# Patient Record
Sex: Male | Born: 1935
Health system: Southern US, Community
[De-identification: ages and names within clinical notes are randomized; demographics above are authoritative.]

## PROBLEM LIST (undated history)

## (undated) DIAGNOSIS — Z974 Presence of external hearing-aid: Secondary | ICD-10-CM

## (undated) DIAGNOSIS — Z789 Other specified health status: Secondary | ICD-10-CM

## (undated) DIAGNOSIS — Z8679 Personal history of other diseases of the circulatory system: Secondary | ICD-10-CM

## (undated) DIAGNOSIS — D4620 Refractory anemia with excess of blasts, unspecified: Secondary | ICD-10-CM

## (undated) DIAGNOSIS — Z9889 Other specified postprocedural states: Secondary | ICD-10-CM

## (undated) DIAGNOSIS — Z973 Presence of spectacles and contact lenses: Secondary | ICD-10-CM

## (undated) DIAGNOSIS — N35919 Unspecified urethral stricture, male, unspecified site: Secondary | ICD-10-CM

## (undated) DIAGNOSIS — I723 Aneurysm of iliac artery: Secondary | ICD-10-CM

## (undated) DIAGNOSIS — B029 Zoster without complications: Secondary | ICD-10-CM

## (undated) DIAGNOSIS — Z7189 Other specified counseling: Secondary | ICD-10-CM

## (undated) DIAGNOSIS — E039 Hypothyroidism, unspecified: Secondary | ICD-10-CM

## (undated) DIAGNOSIS — D46A Refractory cytopenia with multilineage dysplasia: Secondary | ICD-10-CM

## (undated) DIAGNOSIS — F32A Depression, unspecified: Secondary | ICD-10-CM

## (undated) DIAGNOSIS — F329 Major depressive disorder, single episode, unspecified: Secondary | ICD-10-CM

## (undated) DIAGNOSIS — E785 Hyperlipidemia, unspecified: Secondary | ICD-10-CM

## (undated) DIAGNOSIS — N319 Neuromuscular dysfunction of bladder, unspecified: Secondary | ICD-10-CM

## (undated) DIAGNOSIS — M79673 Pain in unspecified foot: Secondary | ICD-10-CM

## (undated) DIAGNOSIS — I739 Peripheral vascular disease, unspecified: Secondary | ICD-10-CM

## (undated) DIAGNOSIS — Z972 Presence of dental prosthetic device (complete) (partial): Secondary | ICD-10-CM

## (undated) HISTORY — PX: INGUINAL HERNIA REPAIR: SUR1180

## (undated) HISTORY — DX: Hereditary hemochromatosis: E83.110

## (undated) HISTORY — DX: Zoster without complications: B02.9

## (undated) HISTORY — DX: Other specified counseling: Z71.89

## (undated) HISTORY — DX: Pain in unspecified foot: M79.673

## (undated) HISTORY — DX: Neuromuscular dysfunction of bladder, unspecified: N31.9

## (undated) HISTORY — DX: Refractory anemia with excess of blasts, unspecified: D46.20

---

## 2006-04-16 ENCOUNTER — Inpatient Hospital Stay (HOSPITAL_COMMUNITY): Admission: EM | Admit: 2006-04-16 | Discharge: 2006-04-23 | Payer: Self-pay | Admitting: Emergency Medicine

## 2006-04-16 HISTORY — PX: LAMINECTOMY THORACIC FUSION POSTERIOR: SHX5887

## 2006-04-21 ENCOUNTER — Ambulatory Visit: Payer: Self-pay | Admitting: Physical Medicine & Rehabilitation

## 2006-04-23 ENCOUNTER — Inpatient Hospital Stay (HOSPITAL_COMMUNITY)
Admission: RE | Admit: 2006-04-23 | Discharge: 2006-05-12 | Payer: Self-pay | Admitting: Physical Medicine & Rehabilitation

## 2006-06-18 ENCOUNTER — Encounter
Admission: RE | Admit: 2006-06-18 | Discharge: 2006-09-16 | Payer: Self-pay | Admitting: Physical Medicine & Rehabilitation

## 2006-06-18 ENCOUNTER — Ambulatory Visit: Payer: Self-pay | Admitting: Physical Medicine & Rehabilitation

## 2006-08-14 ENCOUNTER — Ambulatory Visit: Payer: Self-pay | Admitting: Physical Medicine & Rehabilitation

## 2006-08-27 ENCOUNTER — Encounter
Admission: RE | Admit: 2006-08-27 | Discharge: 2006-11-19 | Payer: Self-pay | Admitting: Physical Medicine & Rehabilitation

## 2006-11-06 ENCOUNTER — Encounter
Admission: RE | Admit: 2006-11-06 | Discharge: 2007-02-04 | Payer: Self-pay | Admitting: Physical Medicine & Rehabilitation

## 2006-11-06 ENCOUNTER — Ambulatory Visit: Payer: Self-pay | Admitting: Physical Medicine & Rehabilitation

## 2008-04-13 ENCOUNTER — Encounter: Admission: RE | Admit: 2008-04-13 | Discharge: 2008-04-13 | Payer: Self-pay | Admitting: Neurosurgery

## 2008-05-13 ENCOUNTER — Ambulatory Visit (HOSPITAL_COMMUNITY): Admission: RE | Admit: 2008-05-13 | Discharge: 2008-05-13 | Payer: Self-pay | Admitting: Neurosurgery

## 2009-03-22 ENCOUNTER — Encounter (INDEPENDENT_AMBULATORY_CARE_PROVIDER_SITE_OTHER): Payer: Self-pay | Admitting: Neurosurgery

## 2009-03-22 ENCOUNTER — Ambulatory Visit (HOSPITAL_COMMUNITY): Admission: RE | Admit: 2009-03-22 | Discharge: 2009-03-22 | Payer: Self-pay | Admitting: Neurosurgery

## 2009-03-22 ENCOUNTER — Ambulatory Visit: Payer: Self-pay | Admitting: Surgery

## 2010-08-08 ENCOUNTER — Other Ambulatory Visit: Payer: Self-pay | Admitting: Family Medicine

## 2010-08-08 DIAGNOSIS — I714 Abdominal aortic aneurysm, without rupture: Secondary | ICD-10-CM

## 2010-08-09 ENCOUNTER — Ambulatory Visit
Admission: RE | Admit: 2010-08-09 | Discharge: 2010-08-09 | Disposition: A | Discharge status: Home / Self Care | Payer: 59 | Source: Ambulatory Visit | Attending: Family Medicine | Admitting: Family Medicine

## 2010-08-09 DIAGNOSIS — I714 Abdominal aortic aneurysm, without rupture: Secondary | ICD-10-CM

## 2010-09-25 ENCOUNTER — Encounter (INDEPENDENT_AMBULATORY_CARE_PROVIDER_SITE_OTHER): Payer: 59 | Admitting: Vascular Surgery

## 2010-09-25 DIAGNOSIS — I714 Abdominal aortic aneurysm, without rupture: Secondary | ICD-10-CM

## 2010-09-26 NOTE — Consult Note (Signed)
NEW PATIENT CONSULTATION  Andrew [name], Andrew [name] DOB:  08/16/1932                                       09/25/2010 AOZHY#:[id number]  Patient presents today for evaluation of infrarenal abdominal aortic aneurysm.  He is a very pleasant gentleman who initially had discovery of this aneurysm at the time of a fractured spine in 2007.  This has been followed with ultrasound since that time and now has progressed to a level of 4.8 cm by ultrasound.  He has no symptoms referable to his aneurysm.  He does have chronic back pain.  PAST MEDICAL HISTORY:  Remarkable for overall good health.  He is hypothyroid.  Does not have any history of cardiac disease.  He is not diabetic.  SOCIAL HISTORY:  Is married, 2 children.  He is retired.  He quit smoking in 1987 and does not drink alcohol.  FAMILY HISTORY:  Negative for premature obstructive disease.  REVIEW OF SYSTEMS:  No weight loss or gain.  He weighs 211 pounds.  He is 5 feet 8 inches tall. VASCULAR:  Positive for pain in his legs with walking and lying flat. CARDIAC:  Negative. GI:  Constipation. URINARY:  Difficult urination. ENT:  Change in eyesight, otherwise review of systems is negative.  PHYSICAL EXAMINATION:  A well-developed and well-nourished __________ male appearing stated age in no acute distress.  Blood pressure is 134/82, pulse 77, respirations 18.  HEENT is normal.  His chest is clear bilaterally without rales, rhonchi or wheezes.  His carotid artery are without bruits.  He has 2+ radial and 2+ femoral pulses, 2+ popliteal pulses with no evidence of peripheral aneurysms.  Abdomen is soft and nontender, moderately obese.  I do not feel his aneurysm. Musculoskeletal shows no major deformity or cyanosis.  Neurologic:  No focal weakness or paresthesias.  Skin without ulcers or rashes.  I did review his duplex from [facility] and explained the significance of his 4.8 cm aneurysm.  I have recommended  that we see him again in 6 months for a repeat ultrasound and will follow this serially. I explained that the symptoms of leaking aneurysm __________ should this occur, otherwise we will see him again in 6 months.  At his age and otherwise good health, we would entertain aneurysm repair if he had rapid growth or his aneurysm approached 5.5 cm.  We will continue this discussion with him again in 6 months.    Larina Earthly, M.D. Electronically Signed  TFE/MEDQ  D:  09/26/2010  T:  09/26/2010  Job:  [id number]  cc:   Windle Guard, M.D.

## 2010-10-16 NOTE — Assessment & Plan Note (Signed)
FOLLOWUP OFFICE NOTE   Andrew [name] returns to the clinic today for followup evaluation.  He is  a 77 year old gentleman with a history of T12 compression fracture after  injury while cutting wood at home.  He was on the rehabilitation unit  through approximately May 12, 2006.  He then went to the nursing  home for a period of time, but has been home doing outpatient therapies  at [facility].  They are working on balance and progressive  ambulation.  He has had no falls, but he continues to use a walker and  occasionally is able to use a quad-cane in therapy.  With the walker, he  is able to walk more than a half mile at a time.  He did follow up with  Dr. Franky Macho September 30, 2006 and they have a followup scheduled for  November 2008.   In terms of his bladder, he continues to see Dr. Wanda Plump with last  visit being last week.  They are due for a followup in 4 months' time.  He continues to do in and out catheterizations and is using Flomax.  Dr.  Wanda Plump has not started any other medicines for his bladder at this  time.   In terms of his bowels, he has occasional constipation and uses a  suppository most nights.  In terms of pain, he is using Lyrica 75 mg  daily.   REVIEW OF SYSTEMS:  Positive for urinary retention, limb swelling, and  constipation.   MEDICATIONS:  1. Synthroid 112 mcg per day.  2. Flomax 0.4 mg nightly  3. Senokot S p.r.n.  4. Dulcolax suppository daily p.r.n.  5. Lyrica 75 mg daily.   PHYSICAL EXAM:  Well-appearing middle-aged elderly adult male in mild to  no acute discomfort.  Blood pressure 126/65, pulse 73, respiratory rate 19, and O2 saturation  94% on room air.  He has 5/5 strength to his bilateral upper extremities and 4/5 strength  throughout the bilateral lower extremities.  Sensation was intact to  light touch throughout the bilateral lower extremities.  Reflexes were  1+ at the bilateral knees and ankles.   IMPRESSION:  1. Status  post T12 compression fracture with a small epidural      hematoma, stable.  2. Status post decompression and evacuation of hematoma with      arthrodesis T10-L2 April 16, 2006.  3. Neurogenic bowel and bladder, stable.  4. Hypothyroidism.   In the office today, we did give this patient samples of the Lyrica at  75 mg strength.  The patient will finish his therapy over the next  couple of weeks, and we will plan to see him in followup on an as needed  basis.  They would like to decrease cough at this time, and we will not  schedule him for any particular followup.  We will plan on refilling the  Lyrica over the next several months if necessary.           ______________________________  Ellwood Dense, M.D.    DC/MedQ  D:  11/07/2006 13:29:44  T:  11/07/2006 14:51:39  Job #:  [id number]

## 2010-10-19 NOTE — Discharge Summary (Signed)
[name], [name]              ACCOUNT NO.:  [id number]   MEDICAL RECORD NO.:  [id number]          PATIENT TYPE:  INP   LOCATION:  3107                         FACILITY:  [facility]   PHYSICIAN:  Stefani Dama, M.D.  DATE OF BIRTH:  08/20/1932   DATE OF ADMISSION:  04/16/2006  DATE OF DISCHARGE:  04/23/2006                               DISCHARGE SUMMARY   ADMITTING DIAGNOSIS:  T12 compression fracture secondary to fall.   DISCHARGE AND FINAL DIAGNOSIS:  T12 compression fracture with spinal  canal compromise secondary to fall.   CONDITION ON DISCHARGE:  Improving.   HOSPITAL COURSE:  Andrew [name] is a 77 year old individual who  sustained a fall while at home.  He fractured his T12 vertebra and  retropulsed bone into the spinal canal.  He was weak in his lower  extremities on initial evaluation and underwent subsequent surgical  decompression arthrodesis from the level of T10-L2 with pedicle screw  fixation and laminectomy at the T12 level.  Postoperatively, the patient  required some help, gradually become mobilized, drain was in his back  for the first 48 hours postoperatively.  This gradually decreased.  The  patient has had problems with urinary retention and has required  placement of Foley catheter and subsequent straight cath.  He is now in  need of significant inpatient rehabilitation and is being transferred to  the inpatient rehab unit to undergo intensive inpatient rehabilitation.  It is hoped that he will be independent in ambulation.  Bladder function  remains be seen how this resolves.   DISCHARGE AND FINAL DIAGNOSIS:  1. T12 compression fracture with neurologic injury.  2. Urinary retention.  3. Bladder dysfunction.      Stefani Dama, M.D.  Electronically Signed     HJE/MEDQ  D:  04/22/2006  T:  04/23/2006  Job:  [id number]
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
## 2010-10-19 NOTE — Op Note (Signed)
Andrew [name], Andrew [name]              ACCOUNT NO.:  [id number]   MEDICAL RECORD NO.:  [id number]          PATIENT TYPE:  INP   LOCATION:  3172                         FACILITY:  [facility]   PHYSICIAN:  Coletta Memos, M.D.     DATE OF BIRTH:  [birth year]   DATE OF PROCEDURE:  04/16/2006  DATE OF DISCHARGE:                                 OPERATIVE REPORT   PREOPERATIVE DIAGNOSIS:  T12 compression fracture with mild neurologic  deficit.   POSTOPERATIVE DIAGNOSIS:  T12 compression fracture with epidural hematoma.   PROCEDURE:  1. Posterior laminectomy T12 for decompression of spinal canal and      epidural hematoma evacuation.  2. Posterolateral arthrodesis T10 to L2.  3. Segmental fixation T10 to L2 using eight pedicle screws, two screws at      T10, two screws at T11, two screws at L1, two screws at L2.   COMPLICATIONS:  None.   SURGEON:  Coletta Memos, M.D.   ASSISTANT:  Hewitt Shorts, M.D.   INDICATION:  Andrew [name] is a 77 year old who fell while avoiding a  falling tree at his home.  He had immediate onset of severe back pain.  He  had some mild weakness in the lower extremity but severe pain when he tried  to stand.  He was brought by ambulance to [facility] where a  CT scan showed a compression fracture with retropulsed vertebral body in the  spinal canal.  I, therefore, recommended and he agreed to undergo operative  decompression and fixation as this was an unstable fracture also involving  the lamina of T12-L1 and the facet of T12-L1.   PROCEDURE:  Andrew [name] was brought to the operating room, intubated, and  placed under general anesthetic without difficulty.  A Foley catheter was  placed under sterile conditions.  He was then rolled prone onto a Jackson  table and all pressure points were properly padded.  His back was prepped  and he was draped in a sterile fashion.  I infiltrated 20 mL 0.5% lidocaine  with 1:200,000 epinephrine into the  thoracolumbar region.  I opened the skin  with a 10 blade and took this down to the thoracolumbar fascia then, in a  sequential fashion, exposed the lamina of T9, T10, T11, T12, L1, and L2.  I  used fluoroscopy to verify my level.  Once that was done, I then proceeded  to place pedicle screws using fluoroscopic guidance.  Two screws were placed  at T10, those were 4.75 mm screws, 40 mm in length.  Two screws were placed  at T11, those were 5.5 mm screws 40 mm in length.  Two screws were placed at  L1, those were 6.5 mm x 50 mm in length.  Two screws were placed in L2 which  were 6.5 x 50 mm in length.  This this was done with the assistance of Dr.  Shirlean Kelly.  I then decorticated the lamina and posterior aspects of  the bone.  I then proceeded with my decompression of the T12 interlaminar  space and did a laminectomy of T12,  removed the ligamentum flavum, and  encountered epidural hematoma which was not large and easily evacuated.  I  did not try to move the bone back into the vertebral body as I had done what  I felt was a more than adequate posterior decompression of the thecal sac  and spinal cord.  Dr. Newell Coral and I then placed rods connecting all screws.  Two cross-links were also placed.  The rods were secured to the screws as  were the cross-links.  VITOSS was then laid down along with Infuse along the  decorticated margins of bone paying careful attention not to place any of  the VITOSS overlying the laminectomy site.  I then closed the wound in a  layered fashion using Vicryl sutures.  The thoracolumbar fascia,  subcutaneous tissue, and subcuticular layers.  I used a 3-0 nylon suture to  reapproximate the skin edges.  A sterile dressing was applied.  A Hemovac  drain was also placed in the subfascial plane and brought out via separate  stab incision and secured to the skin.  Andrew [name] tolerated the procedure  well.           ______________________________  Coletta Memos, M.D.     KC/MEDQ  D:  04/16/2006  T:  04/17/2006  Job:  [id number]
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
## 2010-10-19 NOTE — Discharge Summary (Signed)
Andrew [name], Andrew [name]              ACCOUNT NO.:  [id number]   MEDICAL RECORD NO.:  [id number]          PATIENT TYPE:  IPS   LOCATION:  4007                         FACILITY:  [facility]   PHYSICIAN:  Andrew [name], M.D.   DATE OF BIRTH:  June 01, 1933   DATE OF ADMISSION:  04/23/2006  DATE OF DISCHARGE:  05/12/2006                               DISCHARGE SUMMARY   DISCHARGE DIAGNOSES:  1. T12 compression fracture with small epidural hematoma.  2. Status post decompression and evacuation of hematoma and      posterolateral arthrodesis, T10-L2, April 16, 2006.  3. Pain management.  4. Neurogenic bowel and bladder.  5. Hypothyroidism.   HISTORY OF PRESENT ILLNESS:  This is a 77 year old white male admitted  April 16, 2006 after a tree-trimming accident and noted immediate  back pain.  CT of the spine showed a T12 compression fracture.  He  underwent posterior laminectomy, T12, for decompression and also  epidural hematoma evacuation, T10-L2 segmental fixation, April 16, 2006, per Dr. Franky [name].  Fitted with back brace.  Foley catheter tube  removed with inability to void.  Followup CT of the spine with good  position of screws, but some residual stenosis at upper end of  laminectomy site.  The patient was admitted for a comprehensive rehab  program.   PAST MEDICAL HISTORY:  See discharge diagnoses.   HABITS:  No alcohol.  Remote smoker.   ALLERGIES:  None.   SOCIAL HISTORY:  Retired.  Lives with his wife.  Wife with limited  lifting.  One-level home, 5 steps to entry.  Local son works.   MEDICATIONS PRIOR TO ADMISSION:  Synthroid daily.   REHABILITATION HOSPITAL COURSE:  The patient was admitted to inpatient  rehab services with therapies initiated on a 3-hour-daily basis  consisting of physical therapy, occupational therapy and rehabilitation  nursing.  The following issues were addressed during the patient's  rehabilitation stay:  Pertaining to Andrew [name]'s T12  compression  fracture and small epidural hematoma, he had undergone decompression and  evacuation of hematoma and posterolateral arthrodesis, T10-L2, November  14, per Dr. Coletta [name].  Surgical site was healing nicely.  Back brace  on when out of bed.  He could don and doff brace at bedside.  Functionally, he had slow progressive gains.  He was essentially max  assist for functional mobility, independent wheelchair level, minimal  assist for upper body activities of daily living, total assist for lower  body.  He did remain on subcutaneous Lovenox throughout his  rehabilitation course for deep venous thrombosis prophylaxis.  His blood  pressures remained well-controlled without the use of antihypertensive  medications.  Noted neurogenic bowel and bladder with very little  results with the use of Urecholine and Flomax.  It was felt at time of  discharge with the patient's inability to do intermittent  catheterizations independently and ultimate plan was discharge to  nursing home facility, that a Foley catheter tube could be reinserted,  his Urecholine would be discontinued, his Flomax would be decreased to  0.4 mg at bedtime.  He could follow up with [facility].  Curb  consult was held with Dr. Wanda [name] of [facility], to follow up in  his office in approximately 1 week after discharge for possibilities of  a second voiding trial versus followup and replacement of Foley catheter  tube as needed.   Latest labs showed a hemoglobin of 12.3, hematocrit 35.7, WBC 10.4,  platelets of 431,000; sodium 139, potassium 4.0, BUN 13, creatinine 0.9.   DISCHARGE MEDICATIONS AT TIME OF DICTATION:  1. Synthroid 112 mcg p.o. daily.  2. Duragesic patch 25 mcg, change every 72 hours,  dispensed 5      patches.  3. Flomax 0.4 mg p.o. nightly.  4. Dulcolax suppository per rectum nightly.  5. Senokot tablets -- two tablets p.o. nightly.  6. Robaxin 500 mg every 6 hours as needed -- spasms.   7. Vicodin 5/500 mg one or two tablets every 4 hours as needed --      pain.   ACTIVITY:  As tolerated with back brace on when out of bed.  The patient  may sit at side of bed to place back brace.   FOLLOWUP:  Follow up with Dr. Coletta [name], Neurosurgery, [phone]; Dr.  Wanda [name], [facility], [phone], with Foley catheter tube inserted  for possibility of voiding trial in the upcoming weeks versus changing  of Foley catheter tube.      [name], P.A.    ______________________________  Andrew [name], M.D.    DA/MEDQ  D:  05/12/2006  T:  05/12/2006  Job:  [id number]   cc:   Andrew [name], M.D.  Windle Guard, M.D.  [facility], M.D.

## 2010-10-19 NOTE — Discharge Summary (Signed)
Andrew [name], Andrew [name]              ACCOUNT NO.:  [id number]   MEDICAL RECORD NO.:  [id number]           PATIENT TYPE:   LOCATION:                                 FACILITY:   PHYSICIAN:  Ellwood Dense, M.D.   DATE OF BIRTH:  09/03/1932   DATE OF ADMISSION:  04/23/2006  DATE OF DISCHARGE:                                 DISCHARGE SUMMARY   NEUROSURGEON:  Coletta Memos, M.D.   PRIMARY CARE PHYSICIAN:  Windle Guard, M.D.   HISTORY OF PRESENT ILLNESS:  Mr. Foiles is a 77 year old adult male  admitted April 16, 2006, after he was involved in a tree trimming  accident.  Apparently the tree that he was trimming did not hit him but he  fell to the ground when he was trying to avoid the tree falling.  He was  noted to have immediate onset of back pain.  On evaluation in the hospital,  CT scan of the thoracic spine showed a T12 compression fracture.  The  patient underwent posterior laminectomy at T12 for decompression and also  epidural hematoma evacuation.  He also underwent a posterolateral  arthrodesis, T10 to L2, with segmental fixation April 16, 2006, by Dr.  Franky Macho.   The patient was fitted with a back brace postoperatively.  He is allowed to  don that and doff that at the edge of the bed.  Foley tube was initially  removed April 18, 2006, but reinserted April 20, 2006, secondary to  problems voiding.  That tube is about ready to be discontinued.  Flomax has  been ordered for urinary retention.   A follow-up CT scan of the lumbar spine showed good position of screws with  some residual stenosis at the upper end of the laminectomy site performed  April 19, 2006.   The patient was evaluated by the rehabilitation physicians and felt to be an  appropriate candidate for inpatient rehabilitation.   REVIEW OF SYSTEMS:  Noncontributory.   PAST MEDICAL HISTORY:  1. Hypothyroidism.  2. History of kidney stones.  3. Hernia repair in the past.   FAMILY HISTORY:   Noncontributory.   SOCIAL HISTORY:  The patient is retired and lives with his wife.  His wife  is limited secondary to rheumatoid arthritis and osteoarthritis.  They live  in a 1-level home with 5 steps to entry.  A local son works.  The patient  does not use alcohol, reports remote tobacco usage.   FUNCTIONAL HISTORY PRIOR TO ADMISSION:  Independent.   ALLERGIES:  No known drug allergies.   MEDICATIONS PRIOR TO ADMISSION:  Synthroid 112 mcg daily.   LABORATORY DATA:  Recent hemoglobin was 14.7 with hematocrit of 42.8,  platelet count of 237,000, and white count of 18.0.  Recent sodium was 139,  potassium 4.0, chloride 107, CO2 26, BUN 14 and creatinine 0.8.   PHYSICAL EXAMINATION:  GENERAL:  A well-appearing elderly adult male lying  in bed in mild to no acute discomfort.  VITAL SIGNS:  Blood pressure 120/70 with pulse 80, respiratory rate 18 and  temperature 98.0.  HEENT:  Normocephalic, atraumatic.  CARDIOVASCULAR:  Regular rate and rhythm, S1, S2, without murmurs.  ABDOMEN:  Soft, obese, nontender, with positive bowel sounds x4.  CHEST:  Clear to auscultation bilaterally.  NEUROLOGIC:  Alert and oriented x3.  Cranial nerves II-XII are intact  grossly.  Bilateral upper extremity exam showed normal bulk and tone  throughout.  Strength was 5-/5 throughout the bilateral upper extremities.  Reflexes were 2+ and symmetrical.  Lower extremity exam showed hip flexion  and knee extension at 3/5.  Bulk and tone were normal, and reflexes were 1+  at the bilateral knees and ankles.  Ankle dorsiflexion was 2/5.  Sensation  was decreased to light touch throughout the bilateral lower extremities.   IMPRESSION:  1. Status post tree falling accident with T12 compression fracture and a      small epidural hematoma.  2. Status post decompression and evacuation of hematoma with      posterolateral arthrodesis, T10 to L2 April 16, 2006, performed by      Dr. Franky Macho.  3. Urinary  retention.   Presently the patient has deficits in activities of daily living, transfers  and ambulation secondary to his above-noted T12 compression fracture and  subsequent thoracic/lumbar fusion.   PLAN:  1. Admit to the rehabilitation unit for daily OT and PT therapies to      advance ADLs, transfers and ambulation.  2. Twenty-four hour nursing for medication administration and monitoring      of vitals along with monitoring of bowel and bladder function.  3. Check admission labs including CBC and CMET Friday, April 25, 2006.  4. Regular diet.  5. Back brace when out of bed,  may don and doff at the edge of the bed.  6. Synthroid 112 mcg p.o. daily.  7. Flomax 0.4 mg p.o. q.h.s.  8. Discontinue IV fluids if not already done.  9. Routine turning to prevent skin breakdown.  10.Check PVR x3 and perform in-and-out catheterization if postvoid      residuals are greater than 300 mL.  11.Discontinue Foley tube if not already done and begin bladder training.   PROGNOSIS:  Good.   ESTIMATED LENGTH OF STAY:  10-20 days.   GOALS:  Modified independent ADLs, and modified independent to standby  assist for transfers and standby assist to minimum assist for ambulation.           ______________________________  Ellwood Dense, M.D.     DC/MEDQ  D:  04/23/2006  T:  04/23/2006  Job:  [id number]

## 2010-10-19 NOTE — Assessment & Plan Note (Signed)
Andrew [name] returns to the clinic today for follow up evaluation.  He  is a 77 year old gentleman who suffered a T12 compression fracture and  required segmental fixation T10-L2 performed April 16, 2006, by Dr.  Franky Macho.  The injury occurred while he was out using a chainsaw,  trimming a tree, and was thrown to the ground.  He was in the [facility] for an extended period of time.  He has been home now and  is receiving home health therapy that should end next week.  He is  ambulating with a rolling walker but still has very poor balance.  He is  still having problems with his bladder and sees Dr. Wanda Plump with next  followup scheduled for 2 weeks.  He continues to do catheterizations on  himself 2-3 times per day.  He has been back to see Dr. Franky Macho and had  his lumbar brace removed at the end of January, 2008.  They are due for  followup next month.   The patient reports that he is having no pain at the present time.  He  does report some numbness of the bilateral legs below knee level along  with cold sensation of his feet.   REVIEW OF SYSTEMS:  Noncontributory.   MEDICATIONS:  1. Synthroid 112 mcg p.o. daily.  2. Flomax 0.4 mg p.o. q.h.s.  3. Senokot S p.r.n.   PHYSICAL EXAM:  A well-appearing middle-aged adult male in no acute  discomfort.  Blood pressure was 123/62 with a pulse of 80, respiratory  rate 16 and O2 saturation 94% on room air.  He has 5/5 strength  throughout the bilateral upper extremities.  Lower extremity exam showed  4/5 strength throughout.  Sensation was intact to light touch throughout  the bilateral lower extremities.   IMPRESSION:  1. Status post T12 compression fracture with small epidural hematoma,      stable.  2. Status post decompression and evacuation of hematoma with      arthrodesis T10-L2 April 16, 2006.  3. Neurogenic bowel and bladder, improved.  4. Hypothyroidism.   In the office today no medication adjustments were  necessary.  He is  having no pain at the present time.  His sensory changes should improve  over the next several months.  He should continue to make improvement  through November, 2008, at least.  Will set him up for outpatient  therapies at this point to include physical therapy at the [facility]  address.  He will be finishing home health therapies over the next week  or so.  I have encouraged him to followup with Dr. Wanda Plump and Dr.  Franky Macho as noted above.  Will plan on seeing him in followup at  approximately 3 months' time.           ______________________________  Ellwood Dense, M.D.     DC/MedQ  D:  08/15/2006 14:22:06  T:  08/16/2006 22:10:31  Job #:  [id number]

## 2010-10-27 ENCOUNTER — Other Ambulatory Visit (HOSPITAL_COMMUNITY): Payer: Self-pay | Admitting: Emergency Medicine

## 2010-10-27 ENCOUNTER — Ambulatory Visit (HOSPITAL_COMMUNITY)
Admission: RE | Admit: 2010-10-27 | Discharge: 2010-10-27 | Disposition: A | Discharge status: Home / Self Care | Payer: 59 | Source: Ambulatory Visit | Attending: Emergency Medicine | Admitting: Emergency Medicine

## 2010-10-27 DIAGNOSIS — T148XXA Other injury of unspecified body region, initial encounter: Secondary | ICD-10-CM

## 2011-02-08 ENCOUNTER — Encounter: Payer: Self-pay | Admitting: Vascular Surgery

## 2011-03-08 LAB — CBC
HCT: 42.3 % (ref 39.0–52.0)
Hemoglobin: 14.6 g/dL (ref 13.0–17.0)
MCHC: 34.4 g/dL (ref 30.0–36.0)
MCV: 104 fL — ABNORMAL HIGH (ref 78.0–100.0)
RDW: 13.3 % (ref 11.5–15.5)

## 2011-03-08 LAB — BASIC METABOLIC PANEL
CO2: 29 mEq/L (ref 19–32)
Calcium: 9.3 mg/dL (ref 8.4–10.5)
Glucose, Bld: 115 mg/dL — ABNORMAL HIGH (ref 70–99)
Sodium: 143 mEq/L (ref 135–145)

## 2011-03-25 ENCOUNTER — Encounter: Payer: Self-pay | Admitting: Vascular Surgery

## 2011-03-26 ENCOUNTER — Encounter (INDEPENDENT_AMBULATORY_CARE_PROVIDER_SITE_OTHER): Payer: 59 | Admitting: Vascular Surgery

## 2011-03-26 ENCOUNTER — Ambulatory Visit (INDEPENDENT_AMBULATORY_CARE_PROVIDER_SITE_OTHER): Payer: 59 | Admitting: Vascular Surgery

## 2011-03-26 ENCOUNTER — Encounter: Payer: Self-pay | Admitting: Vascular Surgery

## 2011-03-26 VITALS — BP 131/77 | HR 80 | Resp 24 | Ht 70.0 in | Wt 217.1 lb

## 2011-03-26 DIAGNOSIS — I714 Abdominal aortic aneurysm, without rupture: Secondary | ICD-10-CM

## 2011-03-26 NOTE — Progress Notes (Signed)
The patient presents today for continued followup of his infrarenal abdominal aortic aneurysm. He has no symptoms referable to this. He does have multiple medical problems. These mainly stemming from severe spine injury. He has difficulty with bladder and bowel function. He has chronic pain. He does walk with a cane but has difficulty with this as well. He has no new cardiac or major medical difficulties. Past Medical History  Diagnosis Date  . Leg pain   . AAA (abdominal aortic aneurysm)   . Foot pain     while lying flat  . Thyroid disease     History  Substance Use Topics  . Smoking status: Former Smoker    Types: Cigarettes    Quit date: 06/03/1985  . Smokeless tobacco: Not on file  . Alcohol Use: No    No family history on file.  No Known Allergies  Current outpatient prescriptions:Bisacodyl (LAXATIVE PO), Take by mouth as needed.  , Disp: , Rfl: ;  doxazosin (CARDURA) 4 MG tablet, Take 4 mg by mouth at bedtime.  , Disp: , Rfl: ;  DULoxetine (CYMBALTA) 30 MG capsule, Take 30 mg by mouth 2 (two) times daily.  , Disp: , Rfl: ;  finasteride (PROSCAR) 5 MG tablet, Take 5 mg by mouth daily.  , Disp: , Rfl:  levothyroxine (SYNTHROID, LEVOTHROID) 125 MCG tablet, Take 125 mcg by mouth daily.  , Disp: , Rfl: ;  Multiple Vitamin (MULTIVITAMIN PO), Take by mouth daily.  , Disp: , Rfl: ;  niacin (NIASPAN) 500 MG CR tablet, Take 500 mg by mouth at bedtime.  , Disp: , Rfl: ;  Vitamin D, Ergocalciferol, (DRISDOL) 50000 UNITS CAPS, Take 50,000 Units by mouth every 21 ( twenty-one) days.  , Disp: , Rfl:   BP 131/77  Pulse 80  Resp 24  Ht 5\' 10"  (1.778 m)  Wt 217 lb 1.6 oz (98.476 kg)  BMI 31.15 kg/m2  Body mass index is 31.15 kg/(m^2).       Physical exam: Well-developed well-nourished white male in no acute distress. Carotid arteries without bruits bilaterally. He has 2+ radial and 2+ posterior tibial pulses bilaterally., Exam reveals moderate obesity and not palpate an aneurysm he has  no tenderness. Heart is regular rate and rhythm chest is clear bilaterally.  Ultrasound abdominal aorta: Axial diameter of 5.0 cm compares to 4.8 cm in March 2012  Have recommended continued six-month interval followup. I did discuss symptoms of aneurysm leaking in this report immediately should emerge from should this occur. He had a frank discussion regarding his quality of life currently and does not wish to consider open repair. I did explain that to person or patient's currently undergo endovascular stent graft repair with a 1-2 day hospitalization. We will continue to follow him at 6 month intervals to rule out increased size.

## 2011-04-02 NOTE — Procedures (Unsigned)
DUPLEX ULTRASOUND OF ABDOMINAL AORTA  INDICATION:  Follow up abdominal aortic aneurysm.  HISTORY: Diabetes:  No. Cardiac:  No. Hypertension:  No. Smoking:  Previous. Connective Tissue Disorder: Family History:  No. Previous Surgery:  No previous aortic intervention.  DUPLEX EXAM:         AP (cm)                   TRANSVERSE (cm) Proximal             2.40 cm                   2.36 cm Mid                  5.01 cm                   4.70 cm Distal               3.68 cm                   3.68 cm Right Iliac          1.36 cm                   1.36 cm Left Iliac           1.41/1.84 cm              1.34/2.08 cm  PREVIOUS:  Date: 08/09/2010 (outside facility)  AP:  4.8  TRANSVERSE: 4.5  IMPRESSION: 1. Infrarenal abdominal aortic aneurysm measuring approximately 5.0 cm     X 4.70 cm without intramural thrombus present. 2. Aneurysmal dilatation also noted involving the left distal common     iliac artery measuring 1.84 cm X 2.08 cm. 3. Minimal diameter increase of the abdominal aortic aneurysm and new     finding of distal left common iliac artery dilatation, since     previous study done at outside facility on 08/09/2010.  ___________________________________________ Larina Earthly, M.D.  SH/MEDQ  D:  03/26/2011  T:  03/26/2011  Job:  [id number]

## 2011-04-11 ENCOUNTER — Encounter: Payer: Self-pay | Admitting: Vascular Surgery

## 2011-04-11 DIAGNOSIS — I714 Abdominal aortic aneurysm, without rupture: Secondary | ICD-10-CM | POA: Insufficient documentation

## 2011-06-04 HISTORY — PX: CATARACT EXTRACTION W/ INTRAOCULAR LENS  IMPLANT, BILATERAL: SHX1307

## 2011-09-17 ENCOUNTER — Other Ambulatory Visit: Payer: Self-pay | Admitting: *Deleted

## 2011-09-17 DIAGNOSIS — I714 Abdominal aortic aneurysm, without rupture: Secondary | ICD-10-CM

## 2011-09-24 ENCOUNTER — Encounter (INDEPENDENT_AMBULATORY_CARE_PROVIDER_SITE_OTHER): Payer: 59 | Admitting: *Deleted

## 2011-09-24 DIAGNOSIS — I714 Abdominal aortic aneurysm, without rupture: Secondary | ICD-10-CM

## 2011-10-03 ENCOUNTER — Other Ambulatory Visit: Payer: Self-pay | Admitting: *Deleted

## 2011-10-03 DIAGNOSIS — I714 Abdominal aortic aneurysm, without rupture: Secondary | ICD-10-CM

## 2011-10-04 ENCOUNTER — Encounter: Payer: Self-pay | Admitting: Vascular Surgery

## 2011-10-04 NOTE — Procedures (Unsigned)
DUPLEX ULTRASOUND OF ABDOMINAL AORTA  INDICATION:  AAA.  HISTORY: Diabetes:  No. Cardiac:  No. Hypertension:  No. Smoking:  Previous. Connective Tissue Disorder: Family History:  No. Previous Surgery:  No.  DUPLEX EXAM:         AP (cm)                   TRANSVERSE (cm) Proximal             2.30 cm                   2.31 cm Mid                  4.93 cm Distal               Not visualized            Not visualized Right Iliac          1.43 cm                   1.40 cm Left Iliac           Not visualized            Not visualized  PREVIOUS:  Date: 03/26/2011  AP:  5.01  TRANSVERSE:  4.70  IMPRESSION:  Stable abdominal aortic aneurysm where visualized.  There was limited visualization due to overlying bowel gas, patient ate breakfast prior to exam.  ___________________________________________ Larina Earthly, M.D.  EM/MEDQ  D:  09/25/2011  T:  09/25/2011  Job:  [id number]

## 2011-10-08 ENCOUNTER — Telehealth: Payer: Self-pay | Admitting: Vascular Surgery

## 2011-10-08 NOTE — Telephone Encounter (Signed)
[name] requested the size of Andrew [name]'s AAA.  Advised [name] that the size was 4.93 when scanned on 09/24/2011 and that Dr. Arbie Cookey recommended follow up scan in 6 months.

## 2012-03-13 ENCOUNTER — Encounter: Payer: Self-pay | Admitting: Neurosurgery

## 2012-03-16 ENCOUNTER — Ambulatory Visit (INDEPENDENT_AMBULATORY_CARE_PROVIDER_SITE_OTHER): Payer: 59 | Admitting: Neurosurgery

## 2012-03-16 ENCOUNTER — Encounter (INDEPENDENT_AMBULATORY_CARE_PROVIDER_SITE_OTHER): Payer: 59 | Admitting: *Deleted

## 2012-03-16 ENCOUNTER — Encounter: Payer: Self-pay | Admitting: Neurosurgery

## 2012-03-16 VITALS — BP 118/75 | HR 68 | Resp 16 | Ht 68.0 in | Wt 220.6 lb

## 2012-03-16 DIAGNOSIS — I714 Abdominal aortic aneurysm, without rupture: Secondary | ICD-10-CM

## 2012-03-16 NOTE — Addendum Note (Signed)
Addended by: Sharee Pimple on: 03/16/2012 11:08 AM   Modules accepted: Orders

## 2012-03-16 NOTE — Progress Notes (Signed)
[facility] AAA/PAD/PVD Office Note  CC: AAA surveillance Referring Physician: Early  History of Present Illness: 78 year old male patient of Dr. Arbie Cookey is with known AAA. The patient denies any unusual abdominal or back pain however he does have complaints of multiple medical problems and failure of multiple body systems and is not sure he wants to continue AAA surveillance.  Past Medical History  Diagnosis Date  . Leg pain   . AAA (abdominal aortic aneurysm)   . Foot pain     while lying flat  . Thyroid disease     ROS: [x]  Positive   [ ]  Denies    General: [ ]  Weight loss, [ ]  Fever, [ ]  chills Neurologic: [ ]  Dizziness, [ ]  Blackouts, [ ]  Seizure [ ]  Stroke, [ ]  "Mini stroke", [ ]  Slurred speech, [ ]  Temporary blindness; [ ]  weakness in arms or legs, [ ]  Hoarseness Cardiac: [ ]  Chest pain/pressure, [ ]  Shortness of breath at rest [ ]  Shortness of breath with exertion, [ ]  Atrial fibrillation or irregular heartbeat Vascular: [ ]  Pain in legs with walking, [ ]  Pain in legs at rest, [ ]  Pain in legs at night,  [ ]  Non-healing ulcer, [ ]  Blood clot in vein/DVT,   Pulmonary: [ ]  Home oxygen, [ ]  Productive cough, [ ]  Coughing up blood, [ ]  Asthma,  [ ]  Wheezing Musculoskeletal:  [ ]  Arthritis, [ ]  Low back pain, [ ]  Joint pain Hematologic: [ ]  Easy Bruising, [ ]  Anemia; [ ]  Hepatitis Gastrointestinal: [ ]  Blood in stool, [ ]  Gastroesophageal Reflux/heartburn, [ ]  Trouble swallowing Urinary: [ ]  chronic Kidney disease, [ ]  on HD - [ ]  MWF or [ ]  TTHS, [ ]  Burning with urination, [ ]  Difficulty urinating Skin: [ ]  Rashes, [ ]  Wounds Psychological: [ ]  Anxiety, [ ]  Depression   Social History History  Substance Use Topics  . Smoking status: Former Smoker    Types: Cigarettes    Quit date: 06/03/1985  . Smokeless tobacco: Not on file  . Alcohol Use: No    Family History Family History  Problem Relation Age of Onset  . Cancer Mother   . Diabetes  Mother   . Cancer Brother     No Known Allergies  Current Outpatient Prescriptions  Medication Sig Dispense Refill  . Bisacodyl (LAXATIVE PO) Take by mouth as needed.        . doxazosin (CARDURA) 4 MG tablet Take 4 mg by mouth at bedtime.        . finasteride (PROSCAR) 5 MG tablet Take 5 mg by mouth daily.        Marland Kitchen levothyroxine (SYNTHROID, LEVOTHROID) 125 MCG tablet Take 125 mcg by mouth daily.        . Multiple Vitamin (MULTIVITAMIN PO) Take by mouth daily.        . niacin (NIASPAN) 500 MG CR tablet Take 500 mg by mouth at bedtime.        . Vitamin D, Ergocalciferol, (DRISDOL) 50000 UNITS CAPS Take 50,000 Units by mouth every 21 ( twenty-one) days.        . DULoxetine (CYMBALTA) 30 MG capsule Take 30 mg by mouth 2 (two) times daily.          Physical Examination  Filed Vitals:   03/16/12 1013  BP: 118/75  Pulse: 68  Resp: 16    Body mass index is 33.54 kg/(m^2).  General:  WDWN in NAD Gait:  Normal HEENT: WNL Eyes: Pupils equal Pulmonary: normal non-labored breathing , without Rales, rhonchi,  wheezing Cardiac: RRR, without  Murmurs, rubs or gallops; No carotid bruits Abdomen: soft, NT, no masses Skin: no rashes, ulcers noted Vascular Exam/Pulses: Lower extremity pulses are dampened, and PT and DP pulses are not palpable, abdominal masses not palpated due to girth  Extremities without ischemic changes, no Gangrene , no cellulitis; no open wounds;  Musculoskeletal: no muscle wasting or atrophy  Neurologic: A&O X 3; Appropriate Affect ; SENSATION: normal; MOTOR FUNCTION:  moving all extremities equally. Speech is fluent/normal  Non-Invasive Vascular Imaging: Maximum diameter today is 5.1 x 4.9 when compared to April 2013 he was 4.9  ASSESSMENT/PLAN: Asymptomatic stable AAA, the patient states with his multiple medical problems he's not sure why he would ever want this fixed if at that point. The patient states he's going to talk to his primary care physician and he may  not follow up here anymore. I did explain to the patient that it is recommended that a AAA of the size is surveillanced at least every 6 months and I was going to obtain a CT scan and have him see Dr. Arbie Cookey at the next appointment but he has declined the CT and states he may come back for duplex and he may not. I did explain to the patient this was his decision and we would respect his wishes. The patient is tentatively scheduled for duplex and see Dr. Arbie Cookey in 6 months, his questions were encouraged and answered.  Lauree Chandler ANP next  Clinic M.D.: Myra Gianotti

## 2012-09-14 ENCOUNTER — Encounter: Payer: Self-pay | Admitting: Vascular Surgery

## 2012-09-15 ENCOUNTER — Encounter: Payer: Self-pay | Admitting: Vascular Surgery

## 2012-09-15 ENCOUNTER — Encounter (INDEPENDENT_AMBULATORY_CARE_PROVIDER_SITE_OTHER): Payer: 59 | Admitting: Vascular Surgery

## 2012-09-15 ENCOUNTER — Ambulatory Visit (INDEPENDENT_AMBULATORY_CARE_PROVIDER_SITE_OTHER): Payer: 59 | Admitting: Vascular Surgery

## 2012-09-15 VITALS — BP 136/80 | HR 68 | Resp 20 | Ht 71.0 in | Wt 220.0 lb

## 2012-09-15 DIAGNOSIS — I714 Abdominal aortic aneurysm, without rupture: Secondary | ICD-10-CM

## 2012-09-15 NOTE — Addendum Note (Signed)
Addended by: Adria Dill L on: 09/15/2012 10:50 AM   Modules accepted: Orders

## 2012-09-15 NOTE — Progress Notes (Signed)
The patient presents today for continued discussion of his domino aortic aneurysm. This is been followed for several years with serial ultrasound of his aorta. He continues to have significant disability due to his spine disease. He has no known cardiac difficulty. He has no symptoms related to his aneurysm.  Past Medical History  Diagnosis Date  . Leg pain   . AAA (abdominal aortic aneurysm)   . Foot pain     while lying flat  . Thyroid disease     History  Substance Use Topics  . Smoking status: Former Smoker    Types: Cigarettes    Quit date: 06/03/1985  . Smokeless tobacco: Not on file  . Alcohol Use: No    Family History  Problem Relation Age of Onset  . Cancer Mother   . Diabetes Mother   . Cancer Brother     No Known Allergies  Current outpatient prescriptions:Bisacodyl (LAXATIVE PO), Take by mouth as needed.  , Disp: , Rfl: ;  doxazosin (CARDURA) 4 MG tablet, Take 4 mg by mouth at bedtime.  , Disp: , Rfl: ;  DULoxetine (CYMBALTA) 30 MG capsule, Take 30 mg by mouth 2 (two) times daily.  , Disp: , Rfl: ;  finasteride (PROSCAR) 5 MG tablet, Take 5 mg by mouth daily.  , Disp: , Rfl:  levothyroxine (SYNTHROID, LEVOTHROID) 125 MCG tablet, Take 125 mcg by mouth daily.  , Disp: , Rfl: ;  Multiple Vitamin (MULTIVITAMIN PO), Take by mouth daily.  , Disp: , Rfl: ;  niacin (NIASPAN) 500 MG CR tablet, Take 500 mg by mouth at bedtime.  , Disp: , Rfl: ;  Vitamin D, Ergocalciferol, (DRISDOL) 50000 UNITS CAPS, Take 50,000 Units by mouth every 21 ( twenty-one) days.  , Disp: , Rfl:   BP 136/80  Pulse 68  Resp 20  Ht 5\' 11"  (1.803 m)  Wt 220 lb (99.791 kg)  BMI 30.7 kg/m2  Body mass index is 30.7 kg/(m^2).       Physical exam: Well-developed, in no acute distress Pulse status 2+ radial 2+ femoral 2+ popliteal and 2+ dorsalis pedis pulses bilaterally with no evidence of peripheral artery aneurysm Abdomen soft, moderate obesity. Prominent aortic pulsation with no tenderness Skin  without ulcers or rashes.  Ultrasound of his aorta was evaluated by myself. This shows maximal diameter of 5.3 cm up slightly from 5.1 cm in October 2013  Impression and plan: Asymptomatic 5.3 cm infrarenal abdominal aortic aneurysm. I again discussed the significance of this at length with the patient and his wife present. I explained that he is approaching the threshold to consider elective repair. He was issues with walking he is not sure that he once to proceed with this. I explained that certainly has a personal decision for him and his family to make. I have recommended reevaluation in 6 months. I have recommended a CT scan to determine if he is a stent graft candidate. He prefers ultrasound followup since he is not sure he wants to proceed with elective repair. We will see him in 6 months with ultrasound

## 2013-03-22 ENCOUNTER — Encounter: Payer: Self-pay | Admitting: Vascular Surgery

## 2013-03-23 ENCOUNTER — Ambulatory Visit (HOSPITAL_COMMUNITY)
Admission: RE | Admit: 2013-03-23 | Discharge: 2013-03-23 | Disposition: A | Discharge status: Home / Self Care | Payer: Medicare Other | Source: Ambulatory Visit | Attending: Vascular Surgery | Admitting: Vascular Surgery

## 2013-03-23 ENCOUNTER — Encounter: Payer: Self-pay | Admitting: Vascular Surgery

## 2013-03-23 ENCOUNTER — Ambulatory Visit (INDEPENDENT_AMBULATORY_CARE_PROVIDER_SITE_OTHER): Payer: 59 | Admitting: Vascular Surgery

## 2013-03-23 VITALS — BP 114/70 | HR 68 | Ht 71.0 in | Wt 211.5 lb

## 2013-03-23 DIAGNOSIS — I714 Abdominal aortic aneurysm, without rupture, unspecified: Secondary | ICD-10-CM | POA: Insufficient documentation

## 2013-03-23 NOTE — Progress Notes (Signed)
Patient has today for continued followup of his known infrarenal abdominal aortic aneurysm. He has had no symptoms referable to this. He reports no change in his health since his last ultrasound 6 months ago. His main disability continues to be with old spinal cord injury with pain and difficulty with walking in both lower extremity loss of bladder control. He has no new cardiac  Past Medical History  Diagnosis Date  . Leg pain   . AAA (abdominal aortic aneurysm)   . Foot pain     while lying flat  . Thyroid disease     History  Substance Use Topics  . Smoking status: Former Smoker    Types: Cigarettes    Quit date: 06/03/1985  . Smokeless tobacco: Not on file  . Alcohol Use: No    Family History  Problem Relation Age of Onset  . Cancer Mother   . Diabetes Mother   . Cancer Brother     No Known Allergies  Current outpatient prescriptions:Bisacodyl (LAXATIVE PO), Take by mouth as needed.  , Disp: , Rfl: ;  ciprofloxacin (CIPRO) 250 MG tablet, Take 1 tablet by mouth 3 (three) times daily., Disp: , Rfl: ;  doxazosin (CARDURA) 4 MG tablet, Take 4 mg by mouth at bedtime.  , Disp: , Rfl: ;  finasteride (PROSCAR) 5 MG tablet, Take 5 mg by mouth daily.  , Disp: , Rfl:  levothyroxine (SYNTHROID, LEVOTHROID) 125 MCG tablet, Take 125 mcg by mouth daily.  , Disp: , Rfl: ;  Multiple Vitamin (MULTIVITAMIN PO), Take by mouth daily.  , Disp: , Rfl: ;  DULoxetine (CYMBALTA) 30 MG capsule, Take 30 mg by mouth 2 (two) times daily.  , Disp: , Rfl: ;  niacin (NIASPAN) 500 MG CR tablet, Take 500 mg by mouth at bedtime.  , Disp: , Rfl:  Vitamin D, Ergocalciferol, (DRISDOL) 50000 UNITS CAPS, Take 50,000 Units by mouth every 21 ( twenty-one) days.  , Disp: , Rfl:   BP 114/70  Pulse 68  Ht 5\' 11"  (1.803 m)  Wt 211 lb 8 oz (95.936 kg)  BMI 29.51 kg/m2  SpO2 98%  Body mass index is 29.51 kg/(m^2).       Physical exam: Well-developed well-nourished white male no acute distress.  He is alert and  oriented. Does have difficulty with the gait and uses a cane. Abdomen soft and nontender with no aneurysm palpated with moderate obesity Pulse status 2+ posterior tibial pulses bilaterally  Ultrasound today of his aorta was reviewed by myself. This shows no significant change. Maximal diameter is 5.3 cm unchanged from a study 6 months ago.  I discussed this at length with the patient and his wife present. I explained that there is no statistical benefit for operative repair versus observation currently. Certainly would not recommend any operative treatment. He is unclear as to whether he would proceed with this even if he did have some gross. He is comfortable with a 6 month followup to determine size. I didn't describe symptoms of leaking aneurysm he knows to report immediately should this occur

## 2013-03-23 NOTE — Addendum Note (Signed)
Addended by: Adria Dill L on: 03/23/2013 04:27 PM   Modules accepted: Orders

## 2013-09-28 ENCOUNTER — Ambulatory Visit: Payer: 59 | Admitting: Vascular Surgery

## 2013-09-28 ENCOUNTER — Other Ambulatory Visit (HOSPITAL_COMMUNITY): Payer: 59

## 2013-11-29 ENCOUNTER — Ambulatory Visit (HOSPITAL_BASED_OUTPATIENT_CLINIC_OR_DEPARTMENT_OTHER): Payer: 59

## 2013-11-29 ENCOUNTER — Other Ambulatory Visit: Payer: Self-pay | Admitting: Family

## 2013-11-29 ENCOUNTER — Ambulatory Visit (HOSPITAL_BASED_OUTPATIENT_CLINIC_OR_DEPARTMENT_OTHER): Payer: 59 | Admitting: Hematology & Oncology

## 2013-11-29 ENCOUNTER — Other Ambulatory Visit (HOSPITAL_BASED_OUTPATIENT_CLINIC_OR_DEPARTMENT_OTHER): Payer: 59 | Admitting: Lab

## 2013-11-29 ENCOUNTER — Encounter: Payer: Self-pay | Admitting: Hematology & Oncology

## 2013-11-29 DIAGNOSIS — D510 Vitamin B12 deficiency anemia due to intrinsic factor deficiency: Secondary | ICD-10-CM

## 2013-11-29 LAB — MANUAL DIFFERENTIAL (CHCC SATELLITE)
ALC: 1.2 10*3/uL (ref 0.9–3.3)
ANC (CHCC HP manual diff): 0.6 10*3/uL — ABNORMAL LOW (ref 1.5–6.5)
BAND NEUTROPHILS: 1 % (ref 0–10)
LYMPH: 57 % — AB (ref 14–48)
METAMYELOCYTES PCT: 1 % — AB (ref 0–0)
MONO: 14 % — ABNORMAL HIGH (ref 0–13)
PLT EST ~~LOC~~: DECREASED
SEG: 27 % — ABNORMAL LOW (ref 40–75)

## 2013-11-29 LAB — CMP (CANCER CENTER ONLY)
ALBUMIN: 3.2 g/dL — AB (ref 3.3–5.5)
ALT(SGPT): 33 U/L (ref 10–47)
AST: 33 U/L (ref 11–38)
Alkaline Phosphatase: 63 U/L (ref 26–84)
BUN: 12 mg/dL (ref 7–22)
CO2: 30 mEq/L (ref 18–33)
Calcium: 8.4 mg/dL (ref 8.0–10.3)
Chloride: 105 mEq/L (ref 98–108)
Creat: 0.7 mg/dl (ref 0.6–1.2)
GLUCOSE: 96 mg/dL (ref 73–118)
POTASSIUM: 4.1 meq/L (ref 3.3–4.7)
SODIUM: 140 meq/L (ref 128–145)
Total Bilirubin: 1 mg/dl (ref 0.20–1.60)
Total Protein: 7.4 g/dL (ref 6.4–8.1)

## 2013-11-29 LAB — RETICULOCYTES (CHCC)
ABS Retic: 75.8 10*3/uL (ref 19.0–186.0)
RBC.: 3.16 MIL/uL — ABNORMAL LOW (ref 4.22–5.81)
RETIC CT PCT: 2.4 % — AB (ref 0.4–2.3)

## 2013-11-29 LAB — CBC WITH DIFFERENTIAL (CANCER CENTER ONLY)
HCT: 33.9 % — ABNORMAL LOW (ref 38.7–49.9)
HEMOGLOBIN: 11.1 g/dL — AB (ref 13.0–17.1)
MCH: 35.9 pg — AB (ref 28.0–33.4)
MCHC: 32.7 g/dL (ref 32.0–35.9)
MCV: 110 fL — AB (ref 82–98)
PLATELETS: 111 10*3/uL — AB (ref 145–400)
RBC: 3.09 10*6/uL — AB (ref 4.20–5.70)
RDW: 18.3 % — ABNORMAL HIGH (ref 11.1–15.7)
WBC: 2.2 10*3/uL — ABNORMAL LOW (ref 4.0–10.0)

## 2013-11-29 LAB — VITAMIN B12: Vitamin B-12: 307 pg/mL (ref 211–911)

## 2013-11-29 LAB — CHCC SMEAR

## 2013-11-29 LAB — LACTATE DEHYDROGENASE: LDH: 268 U/L — AB (ref 94–250)

## 2013-11-29 NOTE — Progress Notes (Signed)
Hematology/Oncology Consultation   Name: Andrew [name]      MRN: [id number]    Location: Room/bed info not found  Date: 11/29/2013 Time:3:05 PM   REFERRING PHYSICIAN:  Dr. Zigmund Gottron. Elkins  REASON FOR CONSULT:   Hereditary Hemachromatosis (Homozygous C282Y)     Leukopenia and thrombocytopenia   DIAGNOSIS: Hereditary Hemachromatosis and pancytopenia HISTORY OF PRESENT ILLNESS:  The patient is a very pleasant 80 y.o. Male that had routine labs drawn by his PCP in December 2014. These labs reflected various abnormalities that include excessively high iron and ferritin level as well as an H&H 11.0/32.9, WBC 1.7 and Platelets 113. His ferritin was 2290. His iron saturation was 91%.  He did genetic studies for hemachromatosis. 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  According to the patient, he is not aware of anybody in his family who has a count of the liver problems.   The patient was then referred here to [facility] by Dr. Gwyneth Revels. The pt denies having any symptoms such as painful or swollen joints. He states that there is no history of blood disorders or early cardiac problems that he knows of in his family. He states that he fell in 2007 and broke his back. Because of the injury at the T12 spine he now has to cath himself 4-5 times daily in order to empty his bladder. He states that he has constipation most of the times and with the help of stool softeners he is able to have a bowel movement every 3-4 days. He states that he quit smoking (1PPD) and drinking in 1987. He denies  Fever, chills, cough, nausea, vomiting, diarrhea, blood in urine or stool or new aches or pains.  She is not a vegetarian. His appetite has been okay. She has lost some weight but he has want to lose some weight. He's had no rashes. He's had no bruising. He's had no dysphasia.  ROS: All other 10 point review of systems negative except for those things mentioned above.   PAST MEDICAL  HISTORY:   Past Medical History  Diagnosis Date  . Leg pain   . AAA (abdominal aortic aneurysm)   . Foot pain     while lying flat  . Thyroid disease     ALLERGIES: No Known Allergies    MEDICATIONS: Current outpatient prescriptions:doxazosin (CARDURA) 4 MG tablet, Take 4 mg by mouth at bedtime.  , Disp: , Rfl: ;  finasteride (PROSCAR) 5 MG tablet, Take 5 mg by mouth daily.  , Disp: , Rfl: ;  levothyroxine (SYNTHROID, LEVOTHROID) 125 MCG tablet, Take 125 mcg by mouth daily.  , Disp: , Rfl: ;  lovastatin (MEVACOR) 20 MG tablet, Take 20 mg by mouth at bedtime. , Disp: , Rfl: ;  Bisacodyl (LAXATIVE PO), Take by mouth as needed.  , Disp: , Rfl:  DULoxetine (CYMBALTA) 30 MG capsule, Take 30 mg by mouth 2 (two) times daily.  , Disp: , Rfl: ;  Multiple Vitamin (MULTIVITAMIN PO), Take by mouth daily.  , Disp: , Rfl: ;  niacin (NIASPAN) 500 MG CR tablet, Take 500 mg by mouth at bedtime.  , Disp: , Rfl: ;  Vitamin D, Ergocalciferol, (DRISDOL) 50000 UNITS CAPS, Take 50,000 Units by mouth every 21 ( twenty-one) days.  , Disp: , Rfl:      PAST SURGICAL HISTORY Past Surgical History  Procedure Laterality Date  . Spine surgery  04/16/2006  . Eye surgery  July 2013    Cataract Right eye  . Eye surgery  Aug. 2013    Cataract Left eye   FAMILY HISTORY: Family History  Problem Relation Age of Onset  . Cancer Mother   . Diabetes Mother   . Cancer Brother    SOCIAL HISTORY:  reports that he quit smoking about 28 years ago. His smoking use included Cigarettes. He started smoking about 53 years ago. He has a 50 pack-year smoking history. He has never used smokeless tobacco. He reports that he does not drink alcohol or use illicit drugs.  PERFORMANCE STATUS: The patient's performance status is 1 - Symptomatic but completely ambulatory  PHYSICAL EXAM: Most Recent Vital Signs: Blood pressure 133/67, pulse 74, temperature 97.8 F (36.6 C), temperature source Oral, resp. rate 16, height '5\' 7"'  (1.702 m),  weight 202 lb (91.627 kg). General appearance: alert, cooperative, appears stated age and no distress Neck: no adenopathy, no carotid bruit, no JVD, supple, symmetrical, trachea midline and thyroid not enlarged, symmetric, no tenderness/mass/nodules Back: symmetric, no curvature. ROM normal. No CVA tenderness. Lungs: clear to auscultation bilaterally and normal percussion bilaterally Heart: normal apical impulse, regular rate and rhythm, S1, S2 normal, no click and no rub Abdomen: soft, non-tender; bowel sounds normal; no masses,  no organomegaly Extremities: extremities normal, atraumatic, no cyanosis or edema Pulses: 2+ and symmetric Lymph nodes: Cervical, supraclavicular, and axillary nodes normal. Neurologic: Alert and oriented X 3, normal strength and tone. Normal symmetric reflexes. Normal coordination and gait  LABORATORY DATA:  Results for orders placed in visit on 11/29/13 (from the past 48 hour(s))  [facility] SMEAR     Status: None   Collection Time    11/29/13 12:07 PM      Result Value Ref Range   Smear Result Smear Available    CBC WITH DIFFERENTIAL ([facility])     Status: Abnormal   Collection Time    11/29/13 12:07 PM      Result Value Ref Range   WBC 2.2 (*) 4.0 - 10.0 10e3/uL   RBC 3.09 (*) 4.20 - 5.70 10e6/uL   HGB 11.1 (*) 13.0 - 17.1 g/dL   HCT 33.9 (*) 38.7 - 49.9 %   MCV 110 (*) 82 - 98 fL   MCH 35.9 (*) 28.0 - 33.4 pg   MCHC 32.7  32.0 - 35.9 g/dL   RDW 18.3 (*) 11.1 - 15.7 %   Platelets 111 (*) 145 - 400 10e3/uL  MANUAL DIFFERENTIAL ([facility])     Status: Abnormal   Collection Time    11/29/13 12:07 PM      Result Value Ref Range   ANC ([facility] HP manual diff) 0.6 (*) 1.5 - 6.5 10e3/uL   ALC 1.2  0.9 - 3.3 10e3/uL   SEG 27 (*) 40 - 75 %   Band Neutrophils 1  0 - 10 %   LYMPH 57 (*) 14 - 48 %   MONO 14 (*) 0 - 13 %   Metamyelocytes 1 (*) 0 - 0 %   Polychromasia Slight  Slight   Basophilic Stippling Few     Tear Drop Cell Few  Negative    Ovalocyte Mod  Negative   Shistocyte Few  Negative   PLT EST Amelia Decreased  Adequate     CMP     Component Value Date/Time   NA 140 11/29/2013 1332   NA 143 05/13/2008 1051   K 4.1 11/29/2013 1332   K 3.8 05/13/2008 1051  CL 105 11/29/2013 1332   CL 106 05/13/2008 1051   CO2 30 11/29/2013 1332   CO2 29 05/13/2008 1051   GLUCOSE 96 11/29/2013 1332   GLUCOSE 115* 05/13/2008 1051   BUN 12 11/29/2013 1332   BUN 11 05/13/2008 1051   CREATININE 0.7 11/29/2013 1332   CREATININE 1.01 05/13/2008 1051   CALCIUM 8.4 11/29/2013 1332   CALCIUM 9.3 05/13/2008 1051   PROT 7.4 11/29/2013 1332   AST 33 11/29/2013 1332   ALT 33 11/29/2013 1332   ALKPHOS 63 11/29/2013 1332   BILITOT 1.00 11/29/2013 1332   GFRNONAA >60 05/13/2008 1051   GFRAA  Value: >60        The eGFR has been calculated using the MDRD equation. This calculation has not been validated in all clinical 05/13/2008 1051    RADIOGRAPHY: No results found.     PATHOLOGY:  10/27/2013 2 copies of the C282Y mutation identified. Results for H63D and S65C were negative.  On his blood smear, he had mild anisocytosis and poikilocytosis. He had some teardrop cells. There were no nucleated red cells. I saw him know rouleau formation. He had no target cells. White cells appear normal in morphology maturation. White cells showed increase in lymphocytes. There appear mature. I saw no hypersegmented polys. He had no immature myeloid cells. Platelets were slightly decreased in number. Platelets appeared small.  ASSESSMENT/PLAN: The patient is a pleasant 80 y.o. Male with Hereditary Hemachromatosis and what would appear to possibly be myelodysplastic syndrome.  I find it unusual that at his age, he would have symptomatic hemachromatosis. This typically and appears a lot earlier and then.  I think he probably has 2 different problems. I suspect that myelodysplasia might be a real problem for him.  I really cannot find anything on his physical exam that was  unusual. I cannot palpate his liver or spleen.  We will see what his labs show. We may end up having to do a bone marrow test on him.  For the hemochromatosis, we may need to consider phlebotomizing him although it might be difficult to do this aggressively if he has underlying myelodysplasia.  We discussed his CBC, smear and CMP in detail with the patient and his wife. Retic count, Vit B 12, LDH, Ferritin and Iron studies still pending.   The patient was educated on a low iron diet and education materials were also given to him.   We discussed with him various treatment options such as phlebotomy and possibly his need for a bone marrow biopsy in the future. He understands that we will discuss a more definite plan once we receive the results to his pending labs.   Have requested all abdominal films from [facility].  Follow-up appointment will be scheduled as soon as remaining labs results are received.   Plan discussed with the patient and his wife and they are in agreement.   All questions were answered. The patient knows to call the clinic with any problems, questions or concerns. We can certainly see the patient much sooner if necessary.  We spent about an hour with him. He was seen by my nurse practitioner first and then I saw him and thought he and his wife What I thought was the issues. We answered all of their questions.  We will plan to get him back once we have the lab results back.  [place],SARAH M    ADDENDUM:  I made the appropriate changes to the above note. I examined the  patient and spoke with the patient and his wife. Again, all my recommendations are in the above note.  Lum Keas

## 2013-11-30 LAB — IRON AND TIBC CHCC
%SAT: UNDETERMINED % (ref 20–?)
Iron: 155 ug/dL (ref 42–163)
TIBC: 153 ug/dL — ABNORMAL LOW (ref 202–409)
UIBC: UNDETERMINED ug/dL (ref 117–376)

## 2013-11-30 LAB — FERRITIN CHCC: Ferritin: 2246 ng/ml — ABNORMAL HIGH (ref 22–316)

## 2013-12-21 ENCOUNTER — Other Ambulatory Visit (HOSPITAL_COMMUNITY)
Admission: RE | Admit: 2013-12-21 | Discharge: 2013-12-21 | Disposition: A | Discharge status: Home / Self Care | Payer: Medicare Other | Source: Ambulatory Visit | Attending: Hematology & Oncology | Admitting: Hematology & Oncology

## 2013-12-21 ENCOUNTER — Other Ambulatory Visit (HOSPITAL_BASED_OUTPATIENT_CLINIC_OR_DEPARTMENT_OTHER): Payer: Medicare Other | Admitting: Lab

## 2013-12-21 ENCOUNTER — Ambulatory Visit (HOSPITAL_BASED_OUTPATIENT_CLINIC_OR_DEPARTMENT_OTHER): Payer: Medicare Other | Admitting: Hematology & Oncology

## 2013-12-21 VITALS — BP 134/67 | HR 70 | Temp 98.0°F | Resp 16 | Wt 200.0 lb

## 2013-12-21 DIAGNOSIS — D61818 Other pancytopenia: Secondary | ICD-10-CM | POA: Diagnosis not present

## 2013-12-21 DIAGNOSIS — D649 Anemia, unspecified: Secondary | ICD-10-CM | POA: Insufficient documentation

## 2013-12-21 DIAGNOSIS — D4620 Refractory anemia with excess of blasts, unspecified: Secondary | ICD-10-CM

## 2013-12-21 LAB — CBC WITH DIFFERENTIAL (CANCER CENTER ONLY)
HEMATOCRIT: 31.2 % — AB (ref 38.7–49.9)
HGB: 10.5 g/dL — ABNORMAL LOW (ref 13.0–17.1)
MCH: 36.5 pg — ABNORMAL HIGH (ref 28.0–33.4)
MCHC: 33.7 g/dL (ref 32.0–35.9)
MCV: 108 fL — ABNORMAL HIGH (ref 82–98)
PLATELETS: 131 10*3/uL — AB (ref 145–400)
RBC: 2.88 10*6/uL — AB (ref 4.20–5.70)
RDW: 18.5 % — ABNORMAL HIGH (ref 11.1–15.7)
WBC: 1.8 10*3/uL — AB (ref 4.0–10.0)

## 2013-12-21 LAB — MANUAL DIFFERENTIAL (CHCC SATELLITE)
ALC: 1 10*3/uL (ref 0.9–3.3)
ANC (CHCC HP manual diff): 0.6 10*3/uL — ABNORMAL LOW (ref 1.5–6.5)
LYMPH: 58 % — ABNORMAL HIGH (ref 14–48)
MONO: 11 % (ref 0–13)
Myelocytes: 1 % — ABNORMAL HIGH (ref 0–0)
PLT EST ~~LOC~~: DECREASED
SEG: 30 % — ABNORMAL LOW (ref 40–75)

## 2013-12-21 LAB — BONE MARROW EXAM

## 2013-12-21 NOTE — Progress Notes (Signed)
Pt observed for 30 minutes after the bone marrow biopsy. Vitals assessed and WNL. Pt discharged.

## 2013-12-21 NOTE — Progress Notes (Signed)
This is a procedure note for Andrew [name]. We did a bone marrow biopsy and aspirate. This was to help diagnose his anemia.  He signed the consent form. We did the appropriate time out procedure.  We placed him on his right side. The left posterior iliac crest region was prepped and draped in sterile fashion. 5 cc of 1% lidocaine was infiltrated under the skin and down to the periosteum. A scalpel was used to make an incision into the skin.  No was actually in a separate aspirate needle that we used. We obtained 2 aspirate without difficulty.  We then obtained a bone marrow biopsy core.  He tolerated the procedure well. There were no complications. We cleaned and dressed the procedure site sterilely applied a gauze with tape.  This is a procedure note for Andrew [name]. 

## 2013-12-22 ENCOUNTER — Encounter: Payer: Self-pay | Admitting: Hematology & Oncology

## 2013-12-24 ENCOUNTER — Telehealth: Payer: Self-pay | Admitting: Hematology & Oncology

## 2013-12-24 ENCOUNTER — Encounter: Payer: Self-pay | Admitting: Hematology & Oncology

## 2013-12-24 ENCOUNTER — Ambulatory Visit (HOSPITAL_BASED_OUTPATIENT_CLINIC_OR_DEPARTMENT_OTHER): Payer: Medicare Other | Admitting: Hematology & Oncology

## 2013-12-24 DIAGNOSIS — D46Z Other myelodysplastic syndromes: Secondary | ICD-10-CM

## 2013-12-24 DIAGNOSIS — D72819 Decreased white blood cell count, unspecified: Secondary | ICD-10-CM

## 2013-12-24 DIAGNOSIS — D4620 Refractory anemia with excess of blasts, unspecified: Secondary | ICD-10-CM

## 2013-12-24 HISTORY — DX: Other myelodysplastic syndromes: D46.Z

## 2013-12-24 HISTORY — DX: Refractory anemia with excess of blasts, unspecified: D46.20

## 2013-12-24 NOTE — Telephone Encounter (Signed)
Left pt message to call MD wants to see him today

## 2013-12-25 NOTE — Progress Notes (Signed)
Hematology and Oncology Follow Up Visit  Andrew Andrew [name] [id number] 02-14-1933 80 y.o. 12/25/2013   Principle Diagnosis:   Hemachromatosis (homozygous for C282Y mutation)  Refractory anemia with multilineage dysplasia)  Current Therapy:    Observation     Interim History:  Andrew Andrew [name] is back for followup. We did go ahead and do a bone marrow biopsy on him this week. I really needed to see what his bone marrow was doing. He has some leukopenia and anemia. He does have hemochromatosis. I just have to find out if he had significant myelodysplasia. The bone marrow report ([id number]) showed a hypercellular marrow with some dyspoietic changes. There were no increased blasts. We do not have the cytogenetics report back yet.  When we first saw him, his ferritin was 2246.  We know that he has hemochromatosis. His liver tests are normal. We need to get him on a phlebotomy program. However, we have to watch out for him becoming anemic secondary to the myelodysplasia.  He feels okay. He does feel tired which is more chronic than anything else..  Medications: Current outpatient prescriptions:Bisacodyl (LAXATIVE PO), Take by mouth as needed.  , Disp: , Rfl: ;  doxazosin (CARDURA) 4 MG tablet, Take 4 mg by mouth at bedtime.  , Disp: , Rfl: ;  finasteride (PROSCAR) 5 MG tablet, Take 5 mg by mouth daily.  , Disp: , Rfl: ;  levothyroxine (SYNTHROID) 137 MCG tablet, Take 137 mcg by mouth daily before breakfast., Disp: , Rfl: ;  lovastatin (MEVACOR) 20 MG tablet, Take 20 mg by mouth at bedtime. , Disp: , Rfl:  gabapentin (NEURONTIN) 100 MG capsule, PT REFUSED TO TAKE D/T SIDE EFFECTS, Disp: , Rfl:   Allergies: No Known Allergies  Past Medical History, Surgical history, Social history, and Family History were reviewed and updated.  Review of Systems: As above  Physical Exam:  height is 5' 9" (1.753 m) and weight is 201 lb (91.173 kg). His oral temperature is 97.7 F (36.5 C). His blood pressure is  123/65 and his pulse is 73. His respiration is 18.   Andrew Andrew [name]. Head and neck exam shows no ocular or oral lesions. He has no palpable cervical or supraclavicular lymph nodes. Lungs are clear bilaterally. Cardiac exam regular in rhythm. He has no murmurs. Abdomen is soft. She has good bowel sounds. There is no fluid wave. There is no palpable liver or spleen tip. Extremities shows no clubbing cyanosis or edema. Neurological exam is non-focal. Skin exam no rashes.  Lab Results  Component Value Date   WBC 1.8* 12/21/2013   HGB 10.5* 12/21/2013   HCT 31.2* 12/21/2013   MCV 108* 12/21/2013   PLT 131* 12/21/2013     Chemistry      Component Value Date/Time   NA 140 11/29/2013 1332   NA 143 05/13/2008 1051   K 4.1 11/29/2013 1332   K 3.8 05/13/2008 1051   CL 105 11/29/2013 1332   CL 106 05/13/2008 1051   CO2 30 11/29/2013 1332   CO2 29 05/13/2008 1051   BUN 12 11/29/2013 1332   BUN 11 05/13/2008 1051   CREATININE 0.7 11/29/2013 1332   CREATININE 1.01 05/13/2008 1051      Component Value Date/Time   CALCIUM 8.4 11/29/2013 1332   CALCIUM 9.3 05/13/2008 1051   ALKPHOS 63 11/29/2013 1332   AST 33 11/29/2013 1332   ALT 33 11/29/2013 1332   BILITOT 1.00 11/29/2013 1332  Impression and Plan: Andrew Andrew [name] is 81-year-old Andrew [name]. He has 2 separate problems. He has hemochromatosis. He has myelodysplasia. It will be interesting to see what the cytogenetics studies show for the myelodysplasia.  I do think we have to get him set with a phlebotomy program. We will try to phlebotomize him every other week for right now. I  told him not to take any iron in vitamins. I told him not to take any vitamin C pills. Otherwise, he can eat what he wants and drink what he wants.  Is about 45 minutes with he and his wife. I went over the bone marrow results. I explained to him what hemochromatosis is. I told him the problems with hemochromatosis and tumor time. I told him the problem that we may  have with him and that I don't want to transfuse him. I need to see what his erythropoietin level is. We may be able to use Aranesp to try to maintain his hemoglobin.  This is a pretty complicated situation given his age and the underlying myelodysplasia.  We will have him come back to see us in about 4-6 weeks. We will have him come in every 2 weeks for a phlebotomy and lab work.   ENNEVER,PETER R, MD 7/25/201512:17 PM 

## 2013-12-28 LAB — CHROMOSOME ANALYSIS, BONE MARROW

## 2013-12-28 LAB — TISSUE HYBRIDIZATION (BONE MARROW)-NCBH

## 2013-12-29 ENCOUNTER — Other Ambulatory Visit (HOSPITAL_BASED_OUTPATIENT_CLINIC_OR_DEPARTMENT_OTHER): Payer: Medicare Other | Admitting: Lab

## 2013-12-29 ENCOUNTER — Ambulatory Visit (HOSPITAL_BASED_OUTPATIENT_CLINIC_OR_DEPARTMENT_OTHER): Payer: Medicare Other

## 2013-12-29 DIAGNOSIS — D4620 Refractory anemia with excess of blasts, unspecified: Secondary | ICD-10-CM

## 2013-12-29 LAB — CBC WITH DIFFERENTIAL (CANCER CENTER ONLY)
HCT: 32.1 % — ABNORMAL LOW (ref 38.7–49.9)
HEMOGLOBIN: 11.1 g/dL — AB (ref 13.0–17.1)
MCH: 37.4 pg — ABNORMAL HIGH (ref 28.0–33.4)
MCHC: 34.6 g/dL (ref 32.0–35.9)
MCV: 108 fL — ABNORMAL HIGH (ref 82–98)
Platelets: 116 10*3/uL — ABNORMAL LOW (ref 145–400)
RBC: 2.97 10*6/uL — ABNORMAL LOW (ref 4.20–5.70)
RDW: 18.7 % — ABNORMAL HIGH (ref 11.1–15.7)
WBC: 2.8 10*3/uL — ABNORMAL LOW (ref 4.0–10.0)

## 2013-12-29 LAB — MANUAL DIFFERENTIAL (CHCC SATELLITE)
ALC: 1.4 10*3/uL (ref 0.9–3.3)
ANC (CHCC MAN DIFF): 1.1 10*3/uL — AB (ref 1.5–6.5)
LYMPH: 49 % — ABNORMAL HIGH (ref 14–48)
MONO: 13 % (ref 0–13)
PLT EST ~~LOC~~: DECREASED
SEG: 38 % — ABNORMAL LOW (ref 40–75)
nRBC: 1 % — ABNORMAL HIGH (ref 0–0)

## 2013-12-29 NOTE — Patient Instructions (Signed)

## 2013-12-29 NOTE — Progress Notes (Signed)
Andrew [name] presents today for phlebotomy per MD orders. Phlebotomy procedure started at 1135 and ended at 1145. 540mL removed. Patient observed for 30 minutes after procedure without any incident. Patient tolerated procedure well. IV needle removed intact.

## 2014-01-03 ENCOUNTER — Encounter: Payer: Self-pay | Admitting: Hematology & Oncology

## 2014-01-12 ENCOUNTER — Ambulatory Visit (HOSPITAL_BASED_OUTPATIENT_CLINIC_OR_DEPARTMENT_OTHER): Payer: Medicare Other

## 2014-01-12 ENCOUNTER — Other Ambulatory Visit (HOSPITAL_BASED_OUTPATIENT_CLINIC_OR_DEPARTMENT_OTHER): Payer: Medicare Other | Admitting: Lab

## 2014-01-12 DIAGNOSIS — D4620 Refractory anemia with excess of blasts, unspecified: Secondary | ICD-10-CM

## 2014-01-12 LAB — MANUAL DIFFERENTIAL (CHCC SATELLITE)
ALC: 1.4 10*3/uL (ref 0.9–3.3)
ANC (CHCC MAN DIFF): 0.7 10*3/uL — AB (ref 1.5–6.5)
LYMPH: 59 % — ABNORMAL HIGH (ref 14–48)
MONO: 10 % (ref 0–13)
PLATELET MORPHOLOGY: NORMAL
PLT EST ~~LOC~~: DECREASED
SEG: 31 % — AB (ref 40–75)

## 2014-01-12 LAB — CBC WITH DIFFERENTIAL (CANCER CENTER ONLY)
HCT: 32.1 % — ABNORMAL LOW (ref 38.7–49.9)
HGB: 10.7 g/dL — ABNORMAL LOW (ref 13.0–17.1)
MCH: 36.8 pg — ABNORMAL HIGH (ref 28.0–33.4)
MCHC: 33.3 g/dL (ref 32.0–35.9)
MCV: 110 fL — ABNORMAL HIGH (ref 82–98)
PLATELETS: 108 10*3/uL — AB (ref 145–400)
RBC: 2.91 10*6/uL — ABNORMAL LOW (ref 4.20–5.70)
RDW: 18.8 % — AB (ref 11.1–15.7)
WBC: 2.4 10*3/uL — AB (ref 4.0–10.0)

## 2014-01-12 NOTE — Patient Instructions (Signed)

## 2014-01-26 ENCOUNTER — Other Ambulatory Visit (HOSPITAL_BASED_OUTPATIENT_CLINIC_OR_DEPARTMENT_OTHER): Payer: Medicare Other | Admitting: Lab

## 2014-01-26 ENCOUNTER — Ambulatory Visit (HOSPITAL_BASED_OUTPATIENT_CLINIC_OR_DEPARTMENT_OTHER): Payer: Medicare Other

## 2014-01-26 ENCOUNTER — Ambulatory Visit (HOSPITAL_BASED_OUTPATIENT_CLINIC_OR_DEPARTMENT_OTHER): Payer: Medicare Other | Admitting: Family

## 2014-01-26 ENCOUNTER — Encounter: Payer: Self-pay | Admitting: Family

## 2014-01-26 VITALS — BP 111/67 | HR 69 | Temp 96.8°F | Resp 18 | Ht 69.0 in | Wt 200.0 lb

## 2014-01-26 DIAGNOSIS — D4620 Refractory anemia with excess of blasts, unspecified: Secondary | ICD-10-CM

## 2014-01-26 LAB — MANUAL DIFFERENTIAL (CHCC SATELLITE)
ALC: 1 10*3/uL (ref 0.9–3.3)
ANC (CHCC MAN DIFF): 0.4 10*3/uL — AB (ref 1.5–6.5)
LYMPH: 65 % — ABNORMAL HIGH (ref 14–48)
MONO: 6 % (ref 0–13)
PLT EST ~~LOC~~: DECREASED
SEG: 29 % — ABNORMAL LOW (ref 40–75)
nRBC: 1 % — ABNORMAL HIGH (ref 0–0)

## 2014-01-26 LAB — CMP (CANCER CENTER ONLY)
ALBUMIN: 3.1 g/dL — AB (ref 3.3–5.5)
ALT: 37 U/L (ref 10–47)
AST: 32 U/L (ref 11–38)
Alkaline Phosphatase: 60 U/L (ref 26–84)
BUN, Bld: 12 mg/dL (ref 7–22)
CALCIUM: 8.3 mg/dL (ref 8.0–10.3)
CO2: 28 meq/L (ref 18–33)
Chloride: 101 mEq/L (ref 98–108)
Creat: 0.8 mg/dl (ref 0.6–1.2)
Glucose, Bld: 89 mg/dL (ref 73–118)
POTASSIUM: 3.6 meq/L (ref 3.3–4.7)
Sodium: 138 mEq/L (ref 128–145)
Total Bilirubin: 1.2 mg/dl (ref 0.20–1.60)
Total Protein: 7.2 g/dL (ref 6.4–8.1)

## 2014-01-26 LAB — CBC WITH DIFFERENTIAL (CANCER CENTER ONLY)
HCT: 30.8 % — ABNORMAL LOW (ref 38.7–49.9)
HGB: 10.2 g/dL — ABNORMAL LOW (ref 13.0–17.1)
MCH: 37 pg — AB (ref 28.0–33.4)
MCHC: 33.1 g/dL (ref 32.0–35.9)
MCV: 112 fL — ABNORMAL HIGH (ref 82–98)
PLATELETS: 111 10*3/uL — AB (ref 145–400)
RBC: 2.76 10*6/uL — ABNORMAL LOW (ref 4.20–5.70)
RDW: 19.2 % — ABNORMAL HIGH (ref 11.1–15.7)
WBC: 1.5 10*3/uL — AB (ref 4.0–10.0)

## 2014-01-26 LAB — IRON AND TIBC CHCC
%SAT: 99 % — ABNORMAL HIGH (ref 20–55)
IRON: 144 ug/dL (ref 42–163)
TIBC: 145 ug/dL — ABNORMAL LOW (ref 202–409)
UIBC: <1 ug/dL (ref 117–376)

## 2014-01-26 LAB — FERRITIN CHCC: Ferritin: 2539 ng/ml — ABNORMAL HIGH (ref 22–316)

## 2014-01-26 NOTE — Progress Notes (Signed)
Hematology and Oncology Follow Up Visit  Andrew [name] [id number] September 12, 1932 80 y.o. 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 is back for followup. He's been getting phlebotomized. Thankfully, his hemoglobin has been holding on fairly well. We've not had to transfuse him. I am checking an erythropoietin level on him.  He still has the neuropathy in his legs. He has pain. I wonder if this might be from his Mevacor.  He's had no problems with bowels or bladder.  Medications: Current outpatient prescriptions:Bisacodyl (LAXATIVE PO), Take by mouth as needed.  , Disp: , Rfl: ;  doxazosin (CARDURA) 4 MG tablet, Take 4 mg by mouth at bedtime.  , Disp: , Rfl: ;  finasteride (PROSCAR) 5 MG tablet, Take 5 mg by mouth daily.  , Disp: , Rfl: ;  levothyroxine (SYNTHROID) 137 MCG tablet, Take 137 mcg by mouth daily before breakfast., Disp: , Rfl: ;  lovastatin (MEVACOR) 20 MG tablet, Take 20 mg by mouth at bedtime. , Disp: , Rfl:  gabapentin (NEURONTIN) 100 MG capsule, PT REFUSED TO TAKE D/T SIDE EFFECTS, Disp: , Rfl:   Allergies: No Known Allergies  Past Medical History, Surgical history, Social history, and Family History were reviewed and updated.  Review of Systems: As above  Physical Exam:  height is 5\' 9"  (1.753 m) and weight is 200 lb (90.719 kg). His oral temperature is 96.8 F (36 C). His blood pressure is 111/67 and his pulse is 69. His respiration is 18.   Elderly white woman. Head and neck exam shows no ocular or oral lesions. There are no palpable cervical or supraclavicular lymph nodes. Lungs are clear. Cardiac exam regular in rhythm. Abdomen soft. Has good bowel sounds. There is no fluid wave. There is no palpable liver or spleen tip. Extremities shows no clubbing, cyanosis or  edema. Skin exam no rashes. He has some scattered ecchymoses. Neurological exam shows some slight decrease in sensation in his legs.  Lab Results  Component Value Date   WBC 1.5* 01/26/2014   HGB 10.2* 01/26/2014   HCT 30.8* 01/26/2014   MCV 112* 01/26/2014   PLT 111* 01/26/2014     Chemistry      Component Value Date/Time   NA 138 01/26/2014 0949   NA 143 05/13/2008 1051   K 3.6 01/26/2014 0949   K 3.8 05/13/2008 1051   CL 101 01/26/2014 0949   CL 106 05/13/2008 1051   CO2 28 01/26/2014 0949   CO2 29 05/13/2008 1051   BUN 12 01/26/2014 0949   BUN 11 05/13/2008 1051   CREATININE 0.8 01/26/2014 0949   CREATININE 1.01 05/13/2008 1051      Component Value Date/Time   CALCIUM 8.3 01/26/2014 0949   CALCIUM 9.3 05/13/2008 1051   ALKPHOS 60 01/26/2014 0949   AST 32 01/26/2014 0949   ALT 37 01/26/2014 0949   BILITOT 1.20 01/26/2014 0949         Impression and Plan: [name] is 80 year old come in. He has both myelodysplasia and hemochromatosis. He has an incredible amount of iron. This is probably hemachromatosis was also ineffective erythropoiesis.  I will continue to phlebotomize him.  I am sure that his erythropoietin level will be on the low side. If so, then we can try him on Procrit to try to  get his hemoglobin up and not transfuse him.  We will continue to phlebotomize him.  I want to see him back in one more month. Volanda Napoleon, MD 8/26/201511:38 AM

## 2014-01-26 NOTE — Patient Instructions (Signed)

## 2014-01-26 NOTE — Progress Notes (Signed)
Andrew [name] presents today for phlebotomy per MD orders. Phlebotomy procedure started at 1200 and ended at 1207. Approximately 50 mls removed. Patient observed for 30 minutes after procedure without any incident. Patient tolerated procedure well. IV needle removed intact.

## 2014-01-28 LAB — ERYTHROPOIETIN: Erythropoietin: 43.5 m[IU]/mL — ABNORMAL HIGH (ref 2.6–18.5)

## 2014-01-28 LAB — RETICULOCYTES (CHCC)
ABS RETIC: 98.7 10*3/uL (ref 19.0–186.0)
RBC.: 2.82 MIL/uL — AB (ref 4.22–5.81)
Retic Ct Pct: 3.5 % — ABNORMAL HIGH (ref 0.4–2.3)

## 2014-02-04 ENCOUNTER — Encounter: Payer: Self-pay | Admitting: Vascular Surgery

## 2014-02-08 ENCOUNTER — Ambulatory Visit (INDEPENDENT_AMBULATORY_CARE_PROVIDER_SITE_OTHER): Payer: Medicare Other | Admitting: Vascular Surgery

## 2014-02-08 ENCOUNTER — Other Ambulatory Visit (HOSPITAL_BASED_OUTPATIENT_CLINIC_OR_DEPARTMENT_OTHER): Payer: Medicare Other | Admitting: Lab

## 2014-02-08 ENCOUNTER — Ambulatory Visit (HOSPITAL_COMMUNITY)
Admission: RE | Admit: 2014-02-08 | Discharge: 2014-02-08 | Disposition: A | Discharge status: Home / Self Care | Payer: Medicare Other | Source: Ambulatory Visit | Attending: Vascular Surgery | Admitting: Vascular Surgery

## 2014-02-08 ENCOUNTER — Ambulatory Visit (HOSPITAL_BASED_OUTPATIENT_CLINIC_OR_DEPARTMENT_OTHER): Payer: Medicare Other

## 2014-02-08 ENCOUNTER — Encounter: Payer: Self-pay | Admitting: Vascular Surgery

## 2014-02-08 VITALS — BP 124/60 | HR 69 | Ht 69.0 in | Wt 199.7 lb

## 2014-02-08 DIAGNOSIS — R143 Flatulence: Secondary | ICD-10-CM

## 2014-02-08 DIAGNOSIS — I714 Abdominal aortic aneurysm, without rupture, unspecified: Secondary | ICD-10-CM

## 2014-02-08 DIAGNOSIS — Z23 Encounter for immunization: Secondary | ICD-10-CM

## 2014-02-08 DIAGNOSIS — Z01810 Encounter for preprocedural cardiovascular examination: Secondary | ICD-10-CM

## 2014-02-08 DIAGNOSIS — R141 Gas pain: Secondary | ICD-10-CM | POA: Diagnosis not present

## 2014-02-08 DIAGNOSIS — R142 Eructation: Secondary | ICD-10-CM

## 2014-02-08 DIAGNOSIS — D4620 Refractory anemia with excess of blasts, unspecified: Secondary | ICD-10-CM

## 2014-02-08 LAB — CBC WITH DIFFERENTIAL (CANCER CENTER ONLY)
HCT: 31.1 % — ABNORMAL LOW (ref 38.7–49.9)
HGB: 10.2 g/dL — ABNORMAL LOW (ref 13.0–17.1)
MCH: 36.7 pg — ABNORMAL HIGH (ref 28.0–33.4)
MCHC: 32.8 g/dL (ref 32.0–35.9)
MCV: 112 fL — ABNORMAL HIGH (ref 82–98)
Platelets: 114 10*3/uL — ABNORMAL LOW (ref 145–400)
RBC: 2.78 10*6/uL — ABNORMAL LOW (ref 4.20–5.70)
RDW: 19.5 % — AB (ref 11.1–15.7)
WBC: 2 10*3/uL — ABNORMAL LOW (ref 4.0–10.0)

## 2014-02-08 LAB — CMP (CANCER CENTER ONLY)
ALT(SGPT): 25 U/L (ref 10–47)
AST: 31 U/L (ref 11–38)
Albumin: 3.2 g/dL — ABNORMAL LOW (ref 3.3–5.5)
Alkaline Phosphatase: 59 U/L (ref 26–84)
BUN, Bld: 11 mg/dL (ref 7–22)
CO2: 26 meq/L (ref 18–33)
Calcium: 8.5 mg/dL (ref 8.0–10.3)
Chloride: 103 meq/L (ref 98–108)
Creat: 0.9 mg/dL (ref 0.6–1.2)
Glucose, Bld: 105 mg/dL (ref 73–118)
Potassium: 3.5 meq/L (ref 3.3–4.7)
Sodium: 141 meq/L (ref 128–145)
Total Bilirubin: 1.1 mg/dL (ref 0.20–1.60)
Total Protein: 7.4 g/dL (ref 6.4–8.1)

## 2014-02-08 LAB — MANUAL DIFFERENTIAL (CHCC SATELLITE)
ALC: 1.1 10e3/uL (ref 0.9–3.3)
ANC (CHCC HP manual diff): 0.7 10e3/uL — ABNORMAL LOW (ref 1.5–6.5)
BASO: 1 % (ref 0–2)
Band Neutrophils: 1 % (ref 0–10)
Blasts: 0 % (ref 0–0)
Eos: 0 % (ref 0–7)
LYMPH: 58 % — ABNORMAL HIGH (ref 14–48)
MONO: 6 % (ref 0–13)
Metamyelocytes: 1 % — ABNORMAL HIGH (ref 0–0)
Myelocytes: 1 % — ABNORMAL HIGH (ref 0–0)
Other Cells: 0 % (ref 0–0)
Other Comments: 0
PLT EST ~~LOC~~: DECREASED
PROMYELO: 0 % (ref 0–0)
SEG: 32 % — ABNORMAL LOW (ref 40–75)
Variant Lymph: 0 % (ref 0–0)
nRBC: 1 % — ABNORMAL HIGH (ref 0–0)

## 2014-02-08 LAB — CHCC SATELLITE - SMEAR

## 2014-02-08 LAB — RETICULOCYTES (CHCC)
ABS RETIC: 132.9 10*3/uL (ref 19.0–186.0)
RBC.: 2.89 MIL/uL — ABNORMAL LOW (ref 4.22–5.81)
Retic Ct Pct: 4.6 % — ABNORMAL HIGH (ref 0.4–2.3)

## 2014-02-08 MED ORDER — INFLUENZA VAC SPLIT QUAD 0.5 ML IM SUSY
0.5000 mL | PREFILLED_SYRINGE | Freq: Once | INTRAMUSCULAR | Status: AC
  Administered 2014-02-08: 0.5 mL via INTRAMUSCULAR
  Filled 2014-02-08: qty 0.5

## 2014-02-08 NOTE — Progress Notes (Signed)
Patient name: Andrew [name] MRN: [id number] DOB: [birth year] Sex: male   Referred by: Arelia Sneddon  Reason for referral:  Chief Complaint  Patient presents with  . Re-evaluation    6 month f/u AAA    HISTORY OF PRESENT ILLNESS: Patient is here today for continued discussion regarding his infrarenal abdominal aortic aneurysm. This is been followed for a number of years by ultrasound.. Today for further followup. He was last in our office approximately one year ago with a 5.3 cm aneurysm. I do have results of ultrasound from [facility]. This was from March of 2015 suggesting a 5.7 cm aneurysm. He underwent ultrasound by our office demonstrating a 5.8 cm infrarenal aneurysm. He has no symptoms referable to this. He does have some difficulty with walking related to an old spinal cord injury. He also has myelodysplastic syndrome.  Past Medical History  Diagnosis Date  . Leg pain   . AAA (abdominal aortic aneurysm)   . Foot pain     while lying flat  . Thyroid disease   . Hereditary hemochromatosis 12/24/2013  . MDS (myelodysplastic syndrome), low grade 12/24/2013  . Anemia     Past Surgical History  Procedure Laterality Date  . Spine surgery  04/16/2006  . Eye surgery  July 2013    Cataract Right eye  . Eye surgery  Aug. 2013    Cataract Left eye    History   Social History  . Marital Status: Married    Spouse Name: N/A    Number of Children: N/A  . Years of Education: N/A   Occupational History  . Not on file.   Social History Main Topics  . Smoking status: Former Smoker -- 2.00 packs/day for 25 years    Types: Cigarettes    Start date: 07/01/1960    Quit date: 06/03/1985  . Smokeless tobacco: Never Used     Comment: quit smoking 26 years ago  . Alcohol Use: No  . Drug Use: No  . Sexual Activity: Not on file   Other Topics Concern  . Not on file   Social History Narrative  . No narrative on file    Family History  Problem Relation Age of Onset   . Cancer Mother   . Diabetes Mother   . Cancer Brother     Allergies as of 02/08/2014  . (No Known Allergies)    Current Outpatient Prescriptions on File Prior to Visit  Medication Sig Dispense Refill  . Bisacodyl (LAXATIVE PO) Take by mouth as needed.        . doxazosin (CARDURA) 4 MG tablet Take 4 mg by mouth at bedtime.        . finasteride (PROSCAR) 5 MG tablet Take 5 mg by mouth daily.        Marland Kitchen gabapentin (NEURONTIN) 100 MG capsule PT REFUSED TO TAKE D/T SIDE EFFECTS      . levothyroxine (SYNTHROID) 137 MCG tablet Take 137 mcg by mouth daily before breakfast.      . lovastatin (MEVACOR) 20 MG tablet Take 20 mg by mouth at bedtime.        No current facility-administered medications on file prior to visit.       PHYSICAL EXAMINATION:  General: The patient is a well-nourished male, in no acute distress. He is able to walk with a cane and assistance Vital signs are BP 124/60  Pulse 69  Ht 5\' 9"  (1.753 m)  Wt 199 lb 11.2 oz (  90.583 kg)  BMI 29.48 kg/m2  SpO2 98% Pulmonary: There is a good air exchange bilaterally without wheezing or rales. Abdomen: Soft and non-tender palpable aneurysm which is nontender Musculoskeletal: There are no major deformities.  Neurologic: Generalized lower from a weakness related to prior spinal cord injury Skin: There are no ulcer or rashes noted. Psychiatric: The patient has normal affect. Cardiovascular: Palpable popliteal and pedal pulses. 2+ radial pulses bilaterally  [facility] Vascular Lab Studies:  Ordered and Independently Reviewed maximal aortic aneurysm size 5.8 cm  Impression and Plan:  Continued expansion of aneurysm 5.8 cm. Discussed this at length with the patient and his wife present. I did explain is 5-10% risk of rupture and Andrew [name] related to this. I have recommended CT scan for further evaluation to determine if he is a stent graft candidate versus open candidate. I did explain the magnitude of surgery for open aneurysm repair.  He wishes to proceed with a CT scan for further evaluation and planning. He is on vacation the first week in October and we'll get a CT scan and office visit on his return    Vanity Larsson [facility] [place] Office: [phone]

## 2014-02-08 NOTE — Addendum Note (Signed)
Addended by: Mena Goes on: 02/08/2014 05:23 PM   Modules accepted: Orders

## 2014-02-08 NOTE — Patient Instructions (Addendum)
Therapeutic Phlebotomy, Care After Refer to this sheet in the next few weeks. These instructions provide you with information on caring for yourself after your procedure. Your caregiver may also give you more specific instructions. Your treatment has been planned according to current medical practices, but problems sometimes occur. Call your caregiver if you have any problems or questions after your procedure. HOME CARE INSTRUCTIONS Most people can go back to their normal activities right away. Before you leave, be sure to ask if there is anything you should or should not do. In general, it would be wise to:  Keep the bandage dry. You can remove the bandage after about 5 hours.  Eat well-balanced meals for the next 24 hours.  Drink enough fluids to keep your urine clear or pale yellow.  Avoid drinking alcohol minimally until after eating.  Avoid smoking for at least 30 minutes after the procedure.  Avoid strenuous physical activity or heavy lifting or pulling for about 5 hours after the procedure.  Athletes should avoid strenuous exercise for 12 hours or more.  Change positions slowly for the remainder of the day to prevent light-headedness or fainting.  If you feel light-headed, lie down until the feeling subsides.  If you have bleeding from the needle insertion site, elevate your arm and press firmly on the site until the bleeding stops.  If bruising or bleeding appears under the skin, apply ice to the area for 15 to 20 minutes, 3 to 4 times per day. Put the ice in a plastic bag and place a towel between the bag of ice and your skin. Do this while you are awake for the first 24 hours. The ice packs can be stopped before 24 hours if the swelling goes away. If swelling persists after 24 hours, a warm, moist washcloth can be applied to the area for 15 to 20 minutes, 3 to 4 times per day. The warm, moist treatments can be stopped when the swelling goes away.  It is important to continue  further therapeutic phlebotomy as directed by your caregiver. SEEK MEDICAL CARE IF:  There is bleeding or fluid leaking from the needle insertion site.  The needle insertion site becomes swollen, red, or sore.  You feel light-headed, dizzy or nauseated, and the feeling does not go away.  You notice new bruising at the needle insertion site.  You feel more weak or tired than normal.  You develop a fever. SEEK IMMEDIATE MEDICAL CARE IF:   There is increased bleeding, pain, or swelling from the needle insertion site.  You have severe nausea or vomiting.  You have chest pain.  You have trouble breathing. MAKE SURE YOU:  Understand these instructions.  Will watch your condition.  Will get help right away if you are not doing well or get worse. Document Released: 10/22/2010 Document Revised: 10/04/2013 Document Reviewed: 10/22/2010 [facility] Patient Information 2015 [place], [place]. This information is not intended to replace advice given to you by your health care provider. Make sure you discuss any questions you have with your health care provider. Influenza Virus Vaccine injection (Fluarix) What is this medicine? INFLUENZA VIRUS VACCINE (in floo EN zuh VAHY ruhs vak SEEN) helps to reduce the risk of getting influenza also known as the flu. This medicine may be used for other purposes; ask your health care provider or pharmacist if you have questions. COMMON BRAND NAME(S): Fluarix, Fluzone What should I tell my health care provider before I take this medicine? They need to know if  you have any of these conditions: -bleeding disorder like hemophilia -fever or infection -Guillain-Barre syndrome or other neurological problems -immune system problems -infection with the human immunodeficiency virus (HIV) or AIDS -low blood platelet counts -multiple sclerosis -an unusual or allergic reaction to influenza virus vaccine, eggs, chicken proteins, latex, gentamicin, other medicines,  foods, dyes or preservatives -pregnant or trying to get pregnant -breast-feeding How should I use this medicine? This vaccine is for injection into a muscle. It is given by a health care professional. A copy of Vaccine Information Statements will be given before each vaccination. Read this sheet carefully each time. The sheet may change frequently. Talk to your pediatrician regarding the use of this medicine in children. Special care may be needed. Overdosage: If you think you have taken too much of this medicine contact a poison control center or emergency room at once. NOTE: This medicine is only for you. Do not share this medicine with others. What if I miss a dose? This does not apply. What may interact with this medicine? -chemotherapy or radiation therapy -medicines that lower your immune system like etanercept, anakinra, infliximab, and adalimumab -medicines that treat or prevent blood clots like warfarin -phenytoin -steroid medicines like prednisone or cortisone -theophylline -vaccines This list may not describe all possible interactions. Give your health care provider a list of all the medicines, herbs, non-prescription drugs, or dietary supplements you use. Also tell them if you smoke, drink alcohol, or use illegal drugs. Some items may interact with your medicine. What should I watch for while using this medicine? Report any side effects that do not go away within 3 days to your doctor or health care professional. Call your health care provider if any unusual symptoms occur within 6 weeks of receiving this vaccine. You may still catch the flu, but the illness is not usually as bad. You cannot get the flu from the vaccine. The vaccine will not protect against colds or other illnesses that may cause fever. The vaccine is needed every year. What side effects may I notice from receiving this medicine? Side effects that you should report to your doctor or health care professional as  soon as possible: -allergic reactions like skin rash, itching or hives, swelling of the face, lips, or tongue Side effects that usually do not require medical attention (report to your doctor or health care professional if they continue or are bothersome): -fever -headache -muscle aches and pains -pain, tenderness, redness, or swelling at site where injected -weak or tired This list may not describe all possible side effects. Call your doctor for medical advice about side effects. You may report side effects to FDA at 1-800-FDA-1088. Where should I keep my medicine? This vaccine is only given in a clinic, pharmacy, doctor's office, or other health care setting and will not be stored at home. NOTE: This sheet is a summary. It may not cover all possible information. If you have questions about this medicine, talk to your doctor, pharmacist, or health care provider.  2015, Elsevier/Gold Standard. (2007-12-16 09:30:40)

## 2014-02-08 NOTE — Progress Notes (Signed)
Andrew [name] presents today for phlebotomy per MD orders. Phlebotomy procedure started at 1400 and ended at 1405. 598mL removed. Patient observed for 30 minutes after procedure without any incident. Patient tolerated procedure well. IV needle removed intact.

## 2014-02-09 ENCOUNTER — Other Ambulatory Visit: Payer: Medicare Other | Admitting: Lab

## 2014-02-09 LAB — IRON AND TIBC CHCC
%SAT: >100 %
Iron: 158 ug/dL (ref 42–163)
TIBC: 154 ug/dL — ABNORMAL LOW (ref 202–409)
UIBC: <1 ug/dL (ref 117–376)

## 2014-02-09 LAB — FERRITIN CHCC: Ferritin: 2204 ng/ml — ABNORMAL HIGH (ref 22–316)

## 2014-02-23 ENCOUNTER — Ambulatory Visit (HOSPITAL_BASED_OUTPATIENT_CLINIC_OR_DEPARTMENT_OTHER): Payer: Medicare Other | Admitting: Lab

## 2014-02-23 ENCOUNTER — Ambulatory Visit: Payer: Medicare Other

## 2014-02-23 DIAGNOSIS — D4620 Refractory anemia with excess of blasts, unspecified: Secondary | ICD-10-CM

## 2014-02-23 LAB — MANUAL DIFFERENTIAL (CHCC SATELLITE)
ALC: 1 10*3/uL (ref 0.9–3.3)
ANC (CHCC HP manual diff): 0.7 10*3/uL — ABNORMAL LOW (ref 1.5–6.5)
Band Neutrophils: 1 % (ref 0–10)
LYMPH: 55 % — ABNORMAL HIGH (ref 14–48)
MONO: 9 % (ref 0–13)
PLT EST ~~LOC~~: DECREASED
SEG: 35 % — ABNORMAL LOW (ref 40–75)

## 2014-02-23 LAB — CBC WITH DIFFERENTIAL (CANCER CENTER ONLY)
HEMATOCRIT: 32 % — AB (ref 38.7–49.9)
HEMOGLOBIN: 10.6 g/dL — AB (ref 13.0–17.1)
MCH: 36.9 pg — AB (ref 28.0–33.4)
MCHC: 33.1 g/dL (ref 32.0–35.9)
MCV: 112 fL — AB (ref 82–98)
Platelets: 131 10*3/uL — ABNORMAL LOW (ref 145–400)
RBC: 2.87 10*6/uL — ABNORMAL LOW (ref 4.20–5.70)
RDW: 19.5 % — ABNORMAL HIGH (ref 11.1–15.7)
WBC: 1.9 10*3/uL — ABNORMAL LOW (ref 4.0–10.0)

## 2014-02-23 NOTE — Patient Instructions (Signed)

## 2014-02-23 NOTE — Progress Notes (Signed)
Phlebotomy completed

## 2014-02-23 NOTE — Progress Notes (Signed)
Andrew [name] presents today for phlebotomy per MD orders. Phlebotomy procedure started at 1110 and ended at 1120. 577mL removed. Patient observed for 30 minutes after procedure without any incident. Patient tolerated procedure well. IV needle removed intact.

## 2014-03-09 ENCOUNTER — Ambulatory Visit: Payer: Medicare Other | Admitting: Hematology & Oncology

## 2014-03-09 ENCOUNTER — Other Ambulatory Visit: Payer: Medicare Other | Admitting: Lab

## 2014-03-14 ENCOUNTER — Encounter: Payer: Self-pay | Admitting: Vascular Surgery

## 2014-03-15 ENCOUNTER — Other Ambulatory Visit: Payer: Self-pay

## 2014-03-15 ENCOUNTER — Ambulatory Visit (INDEPENDENT_AMBULATORY_CARE_PROVIDER_SITE_OTHER): Payer: Medicare Other | Admitting: Vascular Surgery

## 2014-03-15 ENCOUNTER — Ambulatory Visit
Admission: RE | Admit: 2014-03-15 | Discharge: 2014-03-15 | Disposition: A | Discharge status: Home / Self Care | Payer: Medicare Other | Source: Ambulatory Visit | Attending: Vascular Surgery | Admitting: Vascular Surgery

## 2014-03-15 ENCOUNTER — Encounter: Payer: Self-pay | Admitting: Vascular Surgery

## 2014-03-15 VITALS — BP 115/61 | HR 92 | Resp 18 | Ht 70.5 in | Wt 200.0 lb

## 2014-03-15 DIAGNOSIS — I714 Abdominal aortic aneurysm, without rupture, unspecified: Secondary | ICD-10-CM | POA: Insufficient documentation

## 2014-03-15 DIAGNOSIS — Z01810 Encounter for preprocedural cardiovascular examination: Secondary | ICD-10-CM

## 2014-03-15 MED ORDER — IOHEXOL 350 MG/ML SOLN
75.0000 mL | Freq: Once | INTRAVENOUS | Status: AC | PRN
Start: 2014-03-15 — End: 2014-03-15
  Administered 2014-03-15: 75 mL via INTRAVENOUS

## 2014-03-15 NOTE — Progress Notes (Signed)
Patient name: Andrew [name] MRN: [id number] DOB: Nov 20, 1932 Sex: male      Reason for referral:  Chief Complaint  Patient presents with  . AAA    f/u  CT prior    HISTORY OF PRESENT ILLNESS: Today for discussion of CT angiogram of his aortic aneurysm. We'll see in approximately one month ago with ultrasound showing progression in size. He has no symptoms are related to his aneurysm. Did just return from a vacation in [place].  Past Medical History  Diagnosis Date  . Leg pain   . AAA (abdominal aortic aneurysm)   . Foot pain     while lying flat  . Thyroid disease   . Hereditary hemochromatosis 12/24/2013  . MDS (myelodysplastic syndrome), low grade 12/24/2013  . Anemia     Past Surgical History  Procedure Laterality Date  . Spine surgery  04/16/2006  . Eye surgery  July 2013    Cataract Right eye  . Eye surgery  Aug. 2013    Cataract Left eye    History   Social History  . Marital Status: Married    Spouse Name: N/A    Number of Children: N/A  . Years of Education: N/A   Occupational History  . Not on file.   Social History Main Topics  . Smoking status: Former Smoker -- 2.00 packs/day for 25 years    Types: Cigarettes    Start date: 07/01/1960    Quit date: 06/03/1985  . Smokeless tobacco: Never Used     Comment: quit smoking 26 years ago  . Alcohol Use: No  . Drug Use: No  . Sexual Activity: Not on file   Other Topics Concern  . Not on file   Social History Narrative  . No narrative on file    Family History  Problem Relation Age of Onset  . Cancer Mother   . Diabetes Mother   . Cancer Brother     Allergies as of 03/15/2014  . (No Known Allergies)    Current Outpatient Prescriptions on File Prior to Visit  Medication Sig Dispense Refill  . Bisacodyl (LAXATIVE PO) Take by mouth as needed.        . doxazosin (CARDURA) 4 MG tablet Take 4 mg by mouth at bedtime.        . finasteride (PROSCAR) 5 MG tablet Take 5 mg by mouth daily.         Marland Kitchen gabapentin (NEURONTIN) 100 MG capsule PT REFUSED TO TAKE D/T SIDE EFFECTS      . levothyroxine (SYNTHROID) 137 MCG tablet Take 137 mcg by mouth daily before breakfast.      . lovastatin (MEVACOR) 20 MG tablet Take 20 mg by mouth at bedtime.        No current facility-administered medications on file prior to visit.        PHYSICAL EXAMINATION:  General: The patient is a well-nourished male, in no acute distress. Vital signs are BP 115/61  Pulse 92  Resp 18  Ht 5' 10.5" (1.791 m)  Wt 200 lb (90.719 kg)  BMI 28.28 kg/m2 Pulmonary: There is a good air exchange  Abdomen: Soft and non-tender. I cannot palpate an aneurysm Musculoskeletal: There are no major deformities.  There is no significant extremity pain. Neurologic: Does have bilateral [place] weakness due to old back injury Skin: There are no ulcer or rashes noted. Psychiatric: The patient has normal affect. Cardiovascular: Easily palpable femoral popliteal and posterior tibial pulses bilaterally  with no evidence of peripheral aneurysm  CT today shows 6.1 cm infrarenal aneurysm. He does have a long infrarenal neck and landing zones in the iliac arteries bilaterally. Normal caliber arteries for access to the aorta for stent graft.  Impression and Plan:  6.1 cm infrarenal abdominal aortic aneurysm. Have recommended elective repair. Explained a stent graft procedure. He does have ideal anatomy for this. We'll get preoperative cardiac clearance. Does have any cardiac history. Also will discuss with Dr.Ennever regarding his hemachromatosis to determine if there is any cautions take around the time of surgery    Andrew [name] [facility] [place] Office: [phone]

## 2014-03-18 ENCOUNTER — Ambulatory Visit (INDEPENDENT_AMBULATORY_CARE_PROVIDER_SITE_OTHER): Payer: Medicare Other | Admitting: Interventional Cardiology

## 2014-03-18 ENCOUNTER — Encounter: Payer: Self-pay | Admitting: Interventional Cardiology

## 2014-03-18 VITALS — BP 104/58 | HR 78 | Ht 70.0 in | Wt 191.8 lb

## 2014-03-18 DIAGNOSIS — I714 Abdominal aortic aneurysm, without rupture, unspecified: Secondary | ICD-10-CM

## 2014-03-18 DIAGNOSIS — Z01810 Encounter for preprocedural cardiovascular examination: Secondary | ICD-10-CM

## 2014-03-18 NOTE — Progress Notes (Signed)
Patient ID: Andrew [name], male   DOB: August 18, 1932, 80 y.o.   MRN: [id number]     Patient ID: Andrew [name] MRN: [id number] DOB/AGE: 05/02/1933 80 y.o.   Referring Physician Dr. Curt [name]   Reason for Consultation preoperative evaluation  HPI: 80 year old man who was diagnosed with an abdominal aortic aneurysm. He has been followed over the past several years and is now grown to the size of 6.1 cm. Intervention is not required. It is thought that he'll be a good candidate for endovascular repair. He is sent here for preoperative cardiovascular assessment.  The patient is limited by back problems which are chronic. His walking is limited. He states that the most strenuous thing he does every day as getting out of bed. He does not do any regular walking. He has never had any prior cardiac evaluation. He has never had a stress test. He denies any heart problems in the past. He does report GI and urologic issues as well.  He does not report any chest discomfort. He is not feel that he is active enough to even get short of breath. No problems with shortness of breath while lying flat.   Current Outpatient Prescriptions  Medication Sig Dispense Refill  . Bisacodyl (LAXATIVE PO) Take by mouth as needed.        . doxazosin (CARDURA) 4 MG tablet Take 4 mg by mouth at bedtime.        . finasteride (PROSCAR) 5 MG tablet Take 5 mg by mouth daily.        Marland Kitchen gabapentin (NEURONTIN) 100 MG capsule PT REFUSED TO TAKE D/T SIDE EFFECTS      . levothyroxine (SYNTHROID) 137 MCG tablet Take 137 mcg by mouth daily before breakfast.      . lovastatin (MEVACOR) 20 MG tablet Take 20 mg by mouth at bedtime.        No current facility-administered medications for this visit.   Past Medical History  Diagnosis Date  . Leg pain   . AAA (abdominal aortic aneurysm)   . Foot pain     while lying flat  . Thyroid disease   . Hereditary hemochromatosis 12/24/2013  . MDS (myelodysplastic syndrome), low grade  12/24/2013  . Anemia     Family History  Problem Relation Age of Onset  . Cancer Mother   . Diabetes Mother   . Cancer Brother     History   Social History  . Marital Status: Married    Spouse Name: N/A    Number of Children: N/A  . Years of Education: N/A   Occupational History  . Not on file.   Social History Main Topics  . Smoking status: Former Smoker -- 2.00 packs/day for 25 years    Types: Cigarettes    Start date: 07/01/1960    Quit date: 06/03/1985  . Smokeless tobacco: Never Used     Comment: quit smoking 26 years ago  . Alcohol Use: No  . Drug Use: No  . Sexual Activity: Not on file   Other Topics Concern  . Not on file   Social History Narrative  . No narrative on file    Past Surgical History  Procedure Laterality Date  . Spine surgery  04/16/2006  . Eye surgery  July 2013    Cataract Right eye  . Eye surgery  Aug. 2013    Cataract Left eye      (Not in a hospital admission)  Review of systems complete and  found to be negative unless listed above .  No nausea, vomiting.  No fever chills, No focal weakness,  No palpitations.  Physical Exam: Filed Vitals:   03/18/14 0828  BP: 104/58  Pulse: 78    Weight: 191 lb 12.8 oz (87 kg)  Physical exam:  [name]/AT EOMI No JVD, No carotid bruit RRR S1S2  No wheezing Soft. NT, nondistended No edema bilaterally. No focal motor or sensory deficits Normal affect  Labs:   Lab Results  Component Value Date   WBC 1.9* 02/23/2014   HGB 10.6* 02/23/2014   HCT 32.0* 02/23/2014   MCV 112* 02/23/2014   PLT 131* 02/23/2014   No results found for this basename: NA, K, CL, CO2, BUN, CREATININE, CALCIUM, LABALBU, PROT, BILITOT, ALKPHOS, ALT, AST, GLUCOSE,  in the last 168 hours No results found for this basename: CKTOTAL, CKMB, CKMBINDEX, TROPONINI    No results found for this basename: CHOL   No results found for this basename: HDL   No results found for this basename: LDLCALC   No results found for this  basename: TRIG   No results found for this basename: CHOLHDL   No results found for this basename: LDLDIRECT       EKG: Normal sinus rhythm, question of a septal infarct, no ST segment changes  ASSESSMENT AND PLAN:  1) preoperative cardiovascular assessment: He does not perform 4 METs of activity. It is difficult to assess his cardiac status do to his orthopedic limitations. I explained the concept of perioperative risk assessment for the patient. Since he is unable to achieve this activity level, the next that would be to perform a pharmacologic stress test. The minute I mentioned the words stress test, he immediately stated he would not have this done. He feels that every time 1 problems discovered, this leads to other evaluations which are costly. He is upset that he has to pay $35 as a co-pay to be seen.  He stated, " I want you to eat breakfast , lunch and dinner, but I want money to eat too."  I explained him that having an abdominal aortic aneurysm is a risk factor for coronary artery disease. He seemed to understand this but still declined stress testing. He also stated that if the stress test was required prior to the surgery, he would "skip the AAA repair."  I told the patient is well informed Dr. early. The patient has no obvious active ischemia or heart failure symptoms. However, his limited activity level does not allow for adequate functional assessment. He would likely tolerate an endovascular procedure well, but it is difficult to truly assess his risk for any type of procedure given his activity level.  2) hyperlipidemia: LDL target should be less than 100 given his AAA.  3) hemochromatosis: There can be cardiac fracture to this including potential systolic or diastolic dysfunction.  He has not had an LV function assessment.  He will call back if he changes his mind about stress test. I will have him followup as needed. Signed:   Mina Marble, MD, [facility] 03/18/2014, 8:57  AM

## 2014-03-18 NOTE — Patient Instructions (Signed)
Your physician recommends that you schedule a follow-up appointment in: AS NEEDED  Your physician recommends that you continue on your current medications as directed. Please refer to the Current Medication list given to you today.  

## 2014-03-21 NOTE — Progress Notes (Signed)
Anesthesia Chart Review:  Patient is a 80 year old male scheduled for EVAR AAA on 04/08/14 by Dr. Donnetta Hutching.  His PAT appointment is scheduled for 03/30/14, Estanislado Pandy, RN at [facility] called and asked me to review his chart due to limited cardiology evaluation due to patient refusing a functional study.  History includes remote former smoker, 6.1 cm AAA, hereditary hemochromatosis (homozygous for C282Y mutation), refractory anemia with multilineage dysplasia, myelodysplastic syndrome (MDS; low grade), hypothyroidism, cataract extraction '13, spinal surgery '07. PCP is listed as Dr. Leonard Downing. Hematologist is Dr. Marin Olp, last visit 01/26/14 with continued periodic phlebotomy recommended.  Dr. Luther Parody note mentions that he plans to contact Dr. Marin Olp regarding plans for surgery to determine if there is any precautions needed from his standpoint.   Dr. Donnetta Hutching sent patient for a preoperative cardiology evaluation with Dr. Irish Lack who saw patient on 03/18/14. Patient's walking is limited and he does not perform 4 METS of activity.  He denied chest pain and SOB but felt he was not active enough to even get short of breath.  Since patient is unable to achieve 4 METS, Dr. Irish Lack recommended a pharmacologic stress test.  Patient did not want to pursue further testing, at least partially due to anticipated costs.  Despite understanding that AAA is a risk factor for CAD, he declined testing and stated, "that if the stress test was required prior to the surgery, he would 'skip the AAA repair.'." Dr. Irish Lack then states, "The patient has no obvious active ischemia or heart failure symptoms. However, his limited activity level does not allow for adequate functional assessment. He would likely tolerate an endovascular procedure well, but it is difficult to truly assess his risk for any type of procedure given his activity level."  EKG on 03/18/14 showed: NSR, septal infarct (age undetermined).  Ultrasound of  the liver/spleen 08/09/13: Hepatosplenomegaly. 5.3 distal AAA. Severe left hydronephrosis.  CTA of the abd/pelvis on 03/15/14: 1. Enlarging infrarenal abdominal aortic aneurysm which begins just below the origin of the IMA. Maximal aneurysm diameter is now 6.1 cm. No evidence of rupture or dissection.  2. Mild fusiform dilatation of the distal common iliac arteries  without focal iliac aneurysm.  3. More prominent appearance of left-sided hydronephrosis and hydroureter. Etiology is not obvious by CT. There is no delay in excretion of contrast into the left renal collecting system. Recommend correlation with prior urologic workup.  4. Chronic lung disease with multiple subcentimeter pulmonary nodules at the lung bases. These may be postinflammatory. Recommend followup CT of the entire chest in 3-6 months.  Meds: Bisacodyl, doxazosin, finasteride, gabapentin, levothyroxine, lovastatin.  I have updated anesthesiologist Dr. Conrad Sprague regarding above.  Recommend evaluation during his PAT visit to ensure that he is still not having any active or new CV symptoms. Further evaluation and labs/CXR pending his 03/30/14 PAT visit.  George Hugh [facility] Short Stay Center/Anesthesiology Phone [phone] 03/21/2014 1:48 PM

## 2014-03-23 ENCOUNTER — Ambulatory Visit (HOSPITAL_BASED_OUTPATIENT_CLINIC_OR_DEPARTMENT_OTHER): Payer: Medicare Other

## 2014-03-23 ENCOUNTER — Other Ambulatory Visit (HOSPITAL_BASED_OUTPATIENT_CLINIC_OR_DEPARTMENT_OTHER): Payer: Medicare Other | Admitting: Lab

## 2014-03-23 DIAGNOSIS — D46Z Other myelodysplastic syndromes: Secondary | ICD-10-CM

## 2014-03-23 DIAGNOSIS — D4620 Refractory anemia with excess of blasts, unspecified: Secondary | ICD-10-CM

## 2014-03-23 LAB — CBC WITH DIFFERENTIAL (CANCER CENTER ONLY)
HEMATOCRIT: 32.8 % — AB (ref 38.7–49.9)
HGB: 11.1 g/dL — ABNORMAL LOW (ref 13.0–17.1)
MCH: 37 pg — AB (ref 28.0–33.4)
MCHC: 33.8 g/dL (ref 32.0–35.9)
MCV: 109 fL — ABNORMAL HIGH (ref 82–98)
Platelets: 127 10*3/uL — ABNORMAL LOW (ref 145–400)
RBC: 3 10*6/uL — ABNORMAL LOW (ref 4.20–5.70)
RDW: 19.1 % — ABNORMAL HIGH (ref 11.1–15.7)
WBC: 1.7 10*3/uL — ABNORMAL LOW (ref 4.0–10.0)

## 2014-03-23 LAB — MANUAL DIFFERENTIAL (CHCC SATELLITE)
ALC: 1 10*3/uL (ref 0.9–3.3)
ANC (CHCC HP manual diff): 0.5 10*3/uL — ABNORMAL LOW (ref 1.5–6.5)
Band Neutrophils: 1 % (ref 0–10)
LYMPH: 57 % — ABNORMAL HIGH (ref 14–48)
MONO: 16 % — AB (ref 0–13)
Metamyelocytes: 2 % — ABNORMAL HIGH (ref 0–0)
Myelocytes: 1 % — ABNORMAL HIGH (ref 0–0)
NRBC: 3 % — AB (ref 0–0)
PLT EST ~~LOC~~: DECREASED
SEG: 23 % — ABNORMAL LOW (ref 40–75)

## 2014-03-23 NOTE — Patient Instructions (Signed)

## 2014-03-23 NOTE — Progress Notes (Signed)
Baron Sane presents today for phlebotomy per MD orders. Phlebotomy procedure started at 1030and ended at 1040. Approximately 500 mlsremoved. Patient observed for 30 minutes after procedure without any incident. Patient tolerated procedure well. IV needle removed intact.

## 2014-03-29 ENCOUNTER — Encounter (HOSPITAL_COMMUNITY): Payer: Self-pay | Admitting: Pharmacy Technician

## 2014-03-30 ENCOUNTER — Other Ambulatory Visit: Payer: Self-pay

## 2014-03-30 ENCOUNTER — Other Ambulatory Visit: Payer: Self-pay | Admitting: Hematology & Oncology

## 2014-03-30 ENCOUNTER — Other Ambulatory Visit: Payer: Self-pay | Admitting: Family

## 2014-03-30 ENCOUNTER — Encounter (HOSPITAL_COMMUNITY)
Admission: RE | Admit: 2014-03-30 | Discharge: 2014-03-30 | Disposition: A | Discharge status: Home / Self Care | Payer: Medicare Other | Source: Ambulatory Visit | Attending: Vascular Surgery | Admitting: Vascular Surgery

## 2014-03-30 ENCOUNTER — Encounter: Payer: Self-pay | Admitting: Nurse Practitioner

## 2014-03-30 ENCOUNTER — Encounter (HOSPITAL_COMMUNITY): Payer: Self-pay

## 2014-03-30 DIAGNOSIS — R827 Abnormal findings on microbiological examination of urine: Secondary | ICD-10-CM | POA: Insufficient documentation

## 2014-03-30 DIAGNOSIS — N1330 Unspecified hydronephrosis: Secondary | ICD-10-CM | POA: Insufficient documentation

## 2014-03-30 DIAGNOSIS — I714 Abdominal aortic aneurysm, without rupture, unspecified: Secondary | ICD-10-CM

## 2014-03-30 DIAGNOSIS — E039 Hypothyroidism, unspecified: Secondary | ICD-10-CM | POA: Insufficient documentation

## 2014-03-30 DIAGNOSIS — Z87891 Personal history of nicotine dependence: Secondary | ICD-10-CM | POA: Diagnosis not present

## 2014-03-30 DIAGNOSIS — R162 Hepatomegaly with splenomegaly, not elsewhere classified: Secondary | ICD-10-CM | POA: Diagnosis not present

## 2014-03-30 DIAGNOSIS — Z22321 Carrier or suspected carrier of Methicillin susceptible Staphylococcus aureus: Secondary | ICD-10-CM | POA: Diagnosis not present

## 2014-03-30 DIAGNOSIS — N319 Neuromuscular dysfunction of bladder, unspecified: Secondary | ICD-10-CM | POA: Insufficient documentation

## 2014-03-30 DIAGNOSIS — N134 Hydroureter: Secondary | ICD-10-CM | POA: Diagnosis not present

## 2014-03-30 DIAGNOSIS — R918 Other nonspecific abnormal finding of lung field: Secondary | ICD-10-CM | POA: Diagnosis not present

## 2014-03-30 DIAGNOSIS — D464 Refractory anemia, unspecified: Secondary | ICD-10-CM | POA: Insufficient documentation

## 2014-03-30 DIAGNOSIS — R531 Weakness: Secondary | ICD-10-CM | POA: Insufficient documentation

## 2014-03-30 DIAGNOSIS — Z01818 Encounter for other preprocedural examination: Secondary | ICD-10-CM | POA: Diagnosis not present

## 2014-03-30 DIAGNOSIS — D469 Myelodysplastic syndrome, unspecified: Secondary | ICD-10-CM | POA: Insufficient documentation

## 2014-03-30 DIAGNOSIS — J449 Chronic obstructive pulmonary disease, unspecified: Secondary | ICD-10-CM | POA: Diagnosis not present

## 2014-03-30 DIAGNOSIS — D4620 Refractory anemia with excess of blasts, unspecified: Secondary | ICD-10-CM

## 2014-03-30 DIAGNOSIS — B9620 Unspecified Escherichia coli [E. coli] as the cause of diseases classified elsewhere: Secondary | ICD-10-CM | POA: Insufficient documentation

## 2014-03-30 DIAGNOSIS — K592 Neurogenic bowel, not elsewhere classified: Secondary | ICD-10-CM | POA: Insufficient documentation

## 2014-03-30 DIAGNOSIS — R8290 Unspecified abnormal findings in urine: Secondary | ICD-10-CM

## 2014-03-30 HISTORY — DX: Depression, unspecified: F32.A

## 2014-03-30 HISTORY — DX: Major depressive disorder, single episode, unspecified: F32.9

## 2014-03-30 HISTORY — DX: Hypothyroidism, unspecified: E03.9

## 2014-03-30 LAB — BLOOD GAS, ARTERIAL
ACID-BASE EXCESS: 0.4 mmol/L (ref 0.0–2.0)
Bicarbonate: 23.5 mEq/L (ref 20.0–24.0)
DRAWN BY: 42180
FIO2: 0.21 %
O2 SAT: 93.1 %
PO2 ART: 68.2 mmHg — AB (ref 80.0–100.0)
Patient temperature: 98.6
TCO2: 24.4 mmol/L (ref 0–100)
pCO2 arterial: 31.4 mmHg — ABNORMAL LOW (ref 35.0–45.0)
pH, Arterial: 7.486 — ABNORMAL HIGH (ref 7.350–7.450)

## 2014-03-30 LAB — URINALYSIS, ROUTINE W REFLEX MICROSCOPIC
Bilirubin Urine: NEGATIVE
Glucose, UA: NEGATIVE mg/dL
HGB URINE DIPSTICK: NEGATIVE
Ketones, ur: NEGATIVE mg/dL
Nitrite: POSITIVE — AB
PROTEIN: NEGATIVE mg/dL
Specific Gravity, Urine: 1.011 (ref 1.005–1.030)
UROBILINOGEN UA: 0.2 mg/dL (ref 0.0–1.0)
pH: 6 (ref 5.0–8.0)

## 2014-03-30 LAB — APTT: APTT: 38 s — AB (ref 24–37)

## 2014-03-30 LAB — COMPREHENSIVE METABOLIC PANEL
ALT: 22 U/L (ref 0–53)
AST: 25 U/L (ref 0–37)
Albumin: 3.3 g/dL — ABNORMAL LOW (ref 3.5–5.2)
Alkaline Phosphatase: 66 U/L (ref 39–117)
Anion gap: 15 (ref 5–15)
BILIRUBIN TOTAL: 0.9 mg/dL (ref 0.3–1.2)
BUN: 9 mg/dL (ref 6–23)
CALCIUM: 8.8 mg/dL (ref 8.4–10.5)
CHLORIDE: 103 meq/L (ref 96–112)
CO2: 21 meq/L (ref 19–32)
Creatinine, Ser: 0.71 mg/dL (ref 0.50–1.35)
GFR calc Af Amer: >90 mL/min (ref >90)
GFR, EST NON AFRICAN AMERICAN: 87 mL/min — AB (ref >90)
GLUCOSE: 89 mg/dL (ref 70–99)
Potassium: 4.2 mEq/L (ref 3.7–5.3)
Sodium: 139 mEq/L (ref 137–147)
Total Protein: 8 g/dL (ref 6.0–8.3)

## 2014-03-30 LAB — CBC
HCT: 25.4 % — ABNORMAL LOW (ref 39.0–52.0)
HEMOGLOBIN: 8.2 g/dL — AB (ref 13.0–17.0)
MCH: 34.9 pg — ABNORMAL HIGH (ref 26.0–34.0)
MCHC: 32.3 g/dL (ref 30.0–36.0)
MCV: 108.1 fL — AB (ref 78.0–100.0)
PLATELETS: 168 10*3/uL (ref 150–400)
RBC: 2.35 MIL/uL — ABNORMAL LOW (ref 4.22–5.81)
RDW: 20 % — ABNORMAL HIGH (ref 11.5–15.5)
WBC: 3.2 10*3/uL — AB (ref 4.0–10.5)

## 2014-03-30 LAB — SURGICAL PCR SCREEN
MRSA, PCR: NEGATIVE
Staphylococcus aureus: POSITIVE — AB

## 2014-03-30 LAB — URINE MICROSCOPIC-ADD ON

## 2014-03-30 LAB — PROTIME-INR
INR: 1.28 (ref 0.00–1.49)
Prothrombin Time: 16.1 seconds — ABNORMAL HIGH (ref 11.6–15.2)

## 2014-03-30 LAB — ABO/RH: ABO/RH(D): O POS

## 2014-03-30 NOTE — Progress Notes (Signed)
Mupirocin ointment Rx called into [facility] on [address] for positive PCR of staph. Spoke with pt's wife and notified her of positive staph. She voiced understanding.

## 2014-03-30 NOTE — Pre-Procedure Instructions (Signed)
Andrew [name]  03/30/2014   Your procedure is scheduled on:  04-08-2014   Friday   Report to [facility] Admitting at 5:30  AM.   Call this number if you have problems the morning of surgery: [phone]   Remember:   Do not eat food or drink liquids after midnight.    Take these medicines the morning of surgery with A SIP OF WATER:doxazosin(Cardura),levothyroxine(Synthroid)    Do not wear jewelry  Do not wear lotions, powders, or perfumes. You may not wear deodorant.  Do not shave 48 hours prior to surgery. Men may shave face and neck.  Do not bring valuables to the hospital.  [facility] is not responsible  for any belongings or valuables.               Contacts, dentures or bridgework may not be worn into surgery.   Leave suitcase in the car. After surgery it may be brought to your room.   For patients admitted to the hospital, discharge time is determined by your  treatment team.                   Special Instructions: See attached sheet for instructions on CHG shower/bath     Please read over the following fact sheets that you were given: Pain Booklet, Coughing and Deep Breathing, Blood Transfusion Information and Surgical Site Infection Prevention

## 2014-03-30 NOTE — Progress Notes (Addendum)
Anesthesia follow-up:  See my note from 03/21/14.  Since then I reviewed his history and cardiologist's Dr. Hassell Done preoperative assessment following patient's refusal to undergo a preoperative stress test with anesthesiologist Dr. Conrad Burnside.  Dr. Conrad Brule wanted to make sure that I re-evaluated him during his PAT visit today to make sure patient had not developed any new CV symptomology.  Patient continues to denies SOB, chest pain.  He has had extensive back surgery in the past and now has issues with LE weakness, neurogenic bowel and bladder (has to self catheterize). He ambulates with a cane and used a hospital wheelchair today.  (Of note,he became upset with me when I asked about his activity tolerance, so I was apologetic which seemed to help.  He says he is just frustrated by all of the tests and doctors that he has had to see for this procedure and if he knew then what he knows now he may not even had wanted to proceed with AAA repair.)   Patient is concerned about the possibility of need for transfusion.  He does not want a transfusion unless Dr. Marin Olp feels it is okay.  I notified [facility] RN Colletta Maryland.  She or Dr. Donnetta Hutching has already been in contact with Dr. Antonieta Pert office.  Per Pickenpack-Cousar, RN note, "Per Dr. Marin Olp there are no precautionary measures that need to be taken from his medical standpoint. Office made aware to continue with the scheduling of this procedure."  Exam shows a Caucasian male in NAD.  He is sitting in a wheelchair.  Wife at side. He has an extensive scar on his back.  Heart RRR, no murmur noted.  Lungs sounds clear.  He has good mouth opening and neck mobility.  There is mild non-pitting ankle edema. I could not palpate a PT pulse bilaterally, but visible skin appeared adequately perfused.    Preoperative labs showed Cr 0.71.  Glucose 89. AST/ALT WNL. WBC 3.2, H/H 8.2/25.4, down from 11.1/32.8 on 03/23/14 (he received phlebotomy, ~ 500 ml removed that day).  PLT count 168K.   PT/INR 16.1/1.28.  PTT 38.  T&S done. UA showed large leukocytes, positive nitrites.  I routed UA results to Dr. Donnetta Hutching and [facility] RN Colletta Maryland.  Defer additional orders to surgeon.  In regards to his anemia, he recent phlebotomy probably accounts for the drop.  Since 11/2013, his HGB has been in the 10-11 range.  I have routed his CBC results to Dr. Marin Olp, Laverna Peace, NP, Dr. Donnetta Hutching, and [facility] RN Colletta Maryland.  He is scheduled to see Judson Roch, NP on 04/06/14. Notification that patient was concerned about any need for perioperative transfusion was also included in my routed message.  Will leave chart for follow-up regarding his 04/06/14 hematology visit and any additional recommendations.   George Hugh [facility] Short Stay Center/Anesthesiology Phone [phone] 03/30/2014 2:56 PM  Addendum: Since 03/30/14, Dr. Marin Olp has transfused patient with 2 Units PRBCs 03/31/14.  Dr. Donnetta Hutching was also planning to treat positive E. Coli on urine culture.  Repeat CBC today showed H/H 12.0/35.5, WBC 1.6, PLT count 105K.  He was seen by hematology this morning with plans to see him in 3 weeks following recovery of his AAA repair.  Patient will need a new T&C on the day of surgery since he had a transfusion last week.  George Hugh [facility] Short Stay Center/Anesthesiology Phone [phone] 04/06/2014 12:27 PM

## 2014-03-30 NOTE — Progress Notes (Signed)
Received a call from Dr. Luther Parody office inquiring about patient having a vascular surgery. Per Dr. Marin Olp there are no precautionary measures that need to be taken from his medical standpoint. Office made aware to continue with the scheduling of this procedure.

## 2014-03-31 ENCOUNTER — Ambulatory Visit (HOSPITAL_BASED_OUTPATIENT_CLINIC_OR_DEPARTMENT_OTHER): Payer: Medicare Other

## 2014-03-31 ENCOUNTER — Other Ambulatory Visit: Payer: Self-pay

## 2014-03-31 VITALS — BP 122/55 | HR 68 | Temp 97.8°F | Resp 16

## 2014-03-31 DIAGNOSIS — D46Z Other myelodysplastic syndromes: Secondary | ICD-10-CM

## 2014-03-31 DIAGNOSIS — I714 Abdominal aortic aneurysm, without rupture, unspecified: Secondary | ICD-10-CM

## 2014-03-31 DIAGNOSIS — D4620 Refractory anemia with excess of blasts, unspecified: Secondary | ICD-10-CM

## 2014-03-31 LAB — PREPARE RBC (CROSSMATCH)

## 2014-03-31 MED ORDER — ACETAMINOPHEN 325 MG PO TABS
ORAL_TABLET | ORAL | Status: AC
  Filled 2014-03-31: qty 2

## 2014-03-31 MED ORDER — FUROSEMIDE 10 MG/ML IJ SOLN
INTRAMUSCULAR | Status: AC
  Filled 2014-03-31: qty 4

## 2014-03-31 MED ORDER — ACETAMINOPHEN 325 MG PO TABS
650.0000 mg | ORAL_TABLET | Freq: Once | ORAL | Status: AC
  Administered 2014-03-31: 650 mg via ORAL

## 2014-03-31 MED ORDER — DIPHENHYDRAMINE HCL 25 MG PO CAPS
25.0000 mg | ORAL_CAPSULE | Freq: Once | ORAL | Status: AC
  Administered 2014-03-31: 25 mg via ORAL

## 2014-03-31 MED ORDER — FUROSEMIDE 10 MG/ML IJ SOLN
20.0000 mg | Freq: Once | INTRAMUSCULAR | Status: AC
  Administered 2014-03-31: 10 mg via INTRAVENOUS

## 2014-03-31 MED ORDER — DIPHENHYDRAMINE HCL 25 MG PO CAPS
ORAL_CAPSULE | ORAL | Status: AC
  Filled 2014-03-31: qty 1

## 2014-03-31 NOTE — Patient Instructions (Signed)

## 2014-03-31 NOTE — Progress Notes (Signed)
Spoke to [employer] at Dr Luther Parody office. Notified her that since we are transfusing patient today, the T&S ordered is no longer active for surgery next week. She will speak to Dr Early regarding any additional order needs.

## 2014-04-01 ENCOUNTER — Encounter: Payer: Self-pay | Admitting: Hematology & Oncology

## 2014-04-01 LAB — TYPE AND SCREEN
ABO/RH(D): O POS
Antibody Screen: NEGATIVE
UNIT DIVISION: 0

## 2014-04-02 LAB — URINE CULTURE: Colony Count: >=100000

## 2014-04-04 ENCOUNTER — Other Ambulatory Visit: Payer: Self-pay

## 2014-04-04 MED ORDER — CIPROFLOXACIN HCL 500 MG PO TABS: 500.0000 mg | ORAL_TABLET | Freq: Two times a day (BID) | ORAL | Status: DC

## 2014-04-05 ENCOUNTER — Other Ambulatory Visit: Payer: Self-pay | Admitting: Family

## 2014-04-06 ENCOUNTER — Other Ambulatory Visit (HOSPITAL_BASED_OUTPATIENT_CLINIC_OR_DEPARTMENT_OTHER): Payer: Medicare Other | Admitting: Lab

## 2014-04-06 ENCOUNTER — Encounter: Payer: Self-pay | Admitting: Family

## 2014-04-06 ENCOUNTER — Ambulatory Visit (HOSPITAL_BASED_OUTPATIENT_CLINIC_OR_DEPARTMENT_OTHER): Payer: Medicare Other | Admitting: Family

## 2014-04-06 ENCOUNTER — Other Ambulatory Visit: Payer: Medicare Other | Admitting: Lab

## 2014-04-06 DIAGNOSIS — D469 Myelodysplastic syndrome, unspecified: Secondary | ICD-10-CM

## 2014-04-06 DIAGNOSIS — D4620 Refractory anemia with excess of blasts, unspecified: Secondary | ICD-10-CM

## 2014-04-06 LAB — CBC WITH DIFFERENTIAL (CANCER CENTER ONLY)
HEMATOCRIT: 35.5 % — AB (ref 38.7–49.9)
HEMOGLOBIN: 12 g/dL — AB (ref 13.0–17.1)
MCH: 36.4 pg — ABNORMAL HIGH (ref 28.0–33.4)
MCHC: 33.8 g/dL (ref 32.0–35.9)
MCV: 108 fL — ABNORMAL HIGH (ref 82–98)
Platelets: 105 10*3/uL — ABNORMAL LOW (ref 145–400)
RBC: 3.3 10*6/uL — ABNORMAL LOW (ref 4.20–5.70)
RDW: 20.9 % — ABNORMAL HIGH (ref 11.1–15.7)
WBC: 1.6 10*3/uL — ABNORMAL LOW (ref 4.0–10.0)

## 2014-04-06 LAB — MANUAL DIFFERENTIAL (CHCC SATELLITE)
ALC: 1.1 10*3/uL (ref 0.9–3.3)
ANC (CHCC HP manual diff): 0.4 10*3/uL — CL (ref 1.5–6.5)
Band Neutrophils: 1 % (ref 0–10)
EOS: 1 % (ref 0–7)
LYMPH: 68 % — AB (ref 14–48)
MONO: 9 % (ref 0–13)
PLT EST ~~LOC~~: DECREASED
Platelet Morphology: NORMAL
SEG: 21 % — ABNORMAL LOW (ref 40–75)

## 2014-04-06 NOTE — Progress Notes (Signed)
[place]  [phone]) [phone] Fax:(336) [phone]  ID: Andrew [name] OB: [birth year] MR#: [id number] Patient Care Team: Leonard Downing, MD as PCP - General (Family Medicine)  DIAGNOSIS: Hemachromatosis (homozygous for C282Y mutation) Refractory anemia with multilineage dysplasia)  INTERVAL HISTORY: Andrew [name] is here today for a follow-up. He is scheduled to have a AAA repair on Friday. His Hgb last week was 8.1 so we gave him 2 units PRBCs last Thursday. His Hgb today is 12.0. His last phlebotomy was 03/23/14. He is feeling better. He denies fever, chills, n/v, cough, rash, headache, dizziness, SOB, chest pain, palpitations, abdominal pain, constipation, diarrhea, blood in urine or stool. No swelling, tenderness, numbness or tingling in his extremities. His appetite is ok and he is drinking plenty of fluids. No bleeding.   CURRENT TREATMENT: Phlebotomy to get the ferritin down to less than 100  REVIEW OF SYSTEMS: All other 10 point review of systems is negative.   PAST MEDICAL HISTORY: Past Medical History  Diagnosis Date  . Leg pain   . AAA (abdominal aortic aneurysm)   . Foot pain     while lying flat  . Thyroid disease   . Hereditary hemochromatosis 12/24/2013  . MDS (myelodysplastic syndrome), low grade 12/24/2013  . Anemia   . Hypothyroidism   . Depression   . Chronic kidney disease     has to self catherize    PAST SURGICAL HISTORY: Past Surgical History  Procedure Laterality Date  . Spine surgery  04/16/2006  . Eye surgery  July 2013    Cataract Right eye  . Eye surgery  Aug. 2013    Cataract Left eye  . Hernia repair      FAMILY HISTORY Family History  Problem Relation Age of Onset  . Cancer Mother   . Diabetes Mother   . Cancer Brother     GYNECOLOGIC HISTORY:  No LMP for male patient.   SOCIAL HISTORY: History   Social History  . Marital Status: Married    Spouse Name: N/A    Number of Children: N/A   . Years of Education: N/A   Occupational History  . Not on file.   Social History Main Topics  . Smoking status: Former Smoker -- 2.00 packs/day for 25 years    Types: Cigarettes    Start date: 07/01/1960    Quit date: 06/03/1985  . Smokeless tobacco: Never Used     Comment: quit smoking 26 years ago  . Alcohol Use: No  . Drug Use: No  . Sexual Activity: Not on file   Other Topics Concern  . Not on file   Social History Narrative    ADVANCED DIRECTIVES:  <no information>  HEALTH MAINTENANCE: History  Substance Use Topics  . Smoking status: Former Smoker -- 2.00 packs/day for 25 years    Types: Cigarettes    Start date: 07/01/1960    Quit date: 06/03/1985  . Smokeless tobacco: Never Used     Comment: quit smoking 26 years ago  . Alcohol Use: No   Colonoscopy: PAP: Bone density: Lipid panel:  No Known Allergies  Current Outpatient Prescriptions  Medication Sig Dispense Refill  . Bisacodyl (LAXATIVE PO) Take 1 tablet by mouth as needed (constipation).     . ciprofloxacin (CIPRO) 500 MG tablet Take 1 tablet (500 mg total) by mouth 2 (two) times daily. For 7 days. (Patient taking differently: Take 500 mg by mouth 2 (two) times daily. For 7 days.  D/C 04-12-14) 14 tablet 0  . doxazosin (CARDURA) 4 MG tablet Take 4 mg by mouth at bedtime.      . finasteride (PROSCAR) 5 MG tablet Take 5 mg by mouth daily.      Marland Kitchen levothyroxine (SYNTHROID) 137 MCG tablet Take 137 mcg by mouth daily before breakfast.    . lovastatin (MEVACOR) 20 MG tablet Take 20 mg by mouth at bedtime.     . Polyvinyl Alcohol-Povidone (REFRESH OP) Place 1 drop into both eyes daily as needed (dry eyes).     No current facility-administered medications for this visit.    OBJECTIVE: Filed Vitals:   04/06/14 1016  BP: 101/54  Pulse: 74  Temp: 97.7 F (36.5 C)  Resp: 18   Filed Weights   04/06/14 1016  Weight: 196 lb (88.905 kg)   ECOG FS:0 - Asymptomatic Ocular: Sclerae unicteric, pupils  equal, round and reactive to light Ear-nose-throat: Oropharynx clear, dentition fair Lymphatic: No cervical or supraclavicular adenopathy Lungs no rales or rhonchi, good excursion bilaterally Heart regular rate and rhythm, no murmur appreciated Abd soft, nontender, positive bowel sounds MSK no focal spinal tenderness, no joint edema Neuro: non-focal, well-oriented, appropriate affect  LAB RESULTS: CMP     Component Value Date/Time   NA 139 03/30/2014 1136   NA 141 02/08/2014 1315   K 4.2 03/30/2014 1136   K 3.5 02/08/2014 1315   CL 103 03/30/2014 1136   CL 103 02/08/2014 1315   CO2 21 03/30/2014 1136   CO2 26 02/08/2014 1315   GLUCOSE 89 03/30/2014 1136   GLUCOSE 105 02/08/2014 1315   BUN 9 03/30/2014 1136   BUN 11 02/08/2014 1315   CREATININE 0.71 03/30/2014 1136   CREATININE 0.9 02/08/2014 1315   CALCIUM 8.8 03/30/2014 1136   CALCIUM 8.5 02/08/2014 1315   PROT 8.0 03/30/2014 1136   PROT 7.4 02/08/2014 1315   ALBUMIN 3.3* 03/30/2014 1136   AST 25 03/30/2014 1136   AST 31 02/08/2014 1315   ALT 22 03/30/2014 1136   ALT 25 02/08/2014 1315   ALKPHOS 66 03/30/2014 1136   ALKPHOS 59 02/08/2014 1315   BILITOT 0.9 03/30/2014 1136   BILITOT 1.10 02/08/2014 1315   GFRNONAA 87* 03/30/2014 1136   GFRAA >90 03/30/2014 1136   INo results found for: SPEP, UPEP Lab Results  Component Value Date   WBC 3.2* 03/30/2014   HGB 8.2* 03/30/2014   HCT 25.4* 03/30/2014   MCV 108.1* 03/30/2014   PLT 168 03/30/2014   No results found for: LABCA2 No components found for: XLKGM010  STUDIES: None  ASSESSMENT/PLAN: Andrew [name] is 80 year old come in. He has both myelodysplasia and hemochromatosis. His last phlebotomy was on 03/23/14. He will have an AAA repair on Friday.  His Hgb today is up to 12.0. He is feeling better.  We will see him back in 3 weeks for labs, follow-up and possibly phlebotomy.   He knows to call here with any questions or concerns and to go to the ED in the event  of an emergency. We can certainly see him sooner if need be.   Eliezer Bottom, NP 04/06/2014 10:37 AM

## 2014-04-07 MED ORDER — DEXTROSE 5 % IV SOLN
1.5000 g | INTRAVENOUS | Status: DC
  Filled 2014-04-07: qty 1.5

## 2014-04-08 ENCOUNTER — Inpatient Hospital Stay (HOSPITAL_COMMUNITY): Payer: Medicare Other

## 2014-04-08 ENCOUNTER — Inpatient Hospital Stay (HOSPITAL_COMMUNITY): Payer: Medicare Other | Admitting: Vascular Surgery

## 2014-04-08 ENCOUNTER — Inpatient Hospital Stay (HOSPITAL_COMMUNITY)
Admission: RE | Admit: 2014-04-08 | Discharge: 2014-04-09 | DRG: 269 | Disposition: A | Discharge status: Home / Self Care | Payer: Medicare Other | Source: Ambulatory Visit | Attending: Vascular Surgery | Admitting: Vascular Surgery

## 2014-04-08 ENCOUNTER — Encounter (HOSPITAL_COMMUNITY): Payer: Self-pay | Admitting: Surgery

## 2014-04-08 ENCOUNTER — Encounter (HOSPITAL_COMMUNITY)
Admission: RE | Disposition: A | Discharge status: Home / Self Care | Payer: Self-pay | Source: Ambulatory Visit | Attending: Vascular Surgery

## 2014-04-08 ENCOUNTER — Other Ambulatory Visit: Payer: Self-pay

## 2014-04-08 ENCOUNTER — Inpatient Hospital Stay (HOSPITAL_COMMUNITY): Payer: Medicare Other | Admitting: Certified Registered Nurse Anesthetist

## 2014-04-08 DIAGNOSIS — Z8679 Personal history of other diseases of the circulatory system: Secondary | ICD-10-CM

## 2014-04-08 DIAGNOSIS — Z95828 Presence of other vascular implants and grafts: Secondary | ICD-10-CM

## 2014-04-08 DIAGNOSIS — I714 Abdominal aortic aneurysm, without rupture, unspecified: Secondary | ICD-10-CM | POA: Diagnosis present

## 2014-04-08 DIAGNOSIS — D46Z Other myelodysplastic syndromes: Secondary | ICD-10-CM | POA: Diagnosis present

## 2014-04-08 DIAGNOSIS — E039 Hypothyroidism, unspecified: Secondary | ICD-10-CM | POA: Diagnosis present

## 2014-04-08 DIAGNOSIS — Z87891 Personal history of nicotine dependence: Secondary | ICD-10-CM

## 2014-04-08 DIAGNOSIS — Z79899 Other long term (current) drug therapy: Secondary | ICD-10-CM

## 2014-04-08 DIAGNOSIS — Z9889 Other specified postprocedural states: Secondary | ICD-10-CM

## 2014-04-08 DIAGNOSIS — Z48812 Encounter for surgical aftercare following surgery on the circulatory system: Secondary | ICD-10-CM

## 2014-04-08 HISTORY — PX: ABDOMINAL AORTIC ENDOVASCULAR STENT GRAFT: SHX5707

## 2014-04-08 LAB — BASIC METABOLIC PANEL
Anion gap: 16 — ABNORMAL HIGH (ref 5–15)
BUN: 13 mg/dL (ref 6–23)
CO2: 20 meq/L (ref 19–32)
CREATININE: 0.71 mg/dL (ref 0.50–1.35)
Calcium: 8.3 mg/dL — ABNORMAL LOW (ref 8.4–10.5)
Chloride: 105 mEq/L (ref 96–112)
GFR calc Af Amer: >90 mL/min (ref >90)
GFR calc non Af Amer: 87 mL/min — ABNORMAL LOW (ref >90)
GLUCOSE: 112 mg/dL — AB (ref 70–99)
Potassium: 4.2 mEq/L (ref 3.7–5.3)
Sodium: 141 mEq/L (ref 137–147)

## 2014-04-08 LAB — PROTIME-INR
INR: 1.3 (ref 0.00–1.49)
Prothrombin Time: 16.4 seconds — ABNORMAL HIGH (ref 11.6–15.2)

## 2014-04-08 LAB — CBC
HEMATOCRIT: 31.5 % — AB (ref 39.0–52.0)
HEMOGLOBIN: 10.3 g/dL — AB (ref 13.0–17.0)
MCH: 34.9 pg — AB (ref 26.0–34.0)
MCHC: 32.7 g/dL (ref 30.0–36.0)
MCV: 106.8 fL — ABNORMAL HIGH (ref 78.0–100.0)
Platelets: 85 10*3/uL — ABNORMAL LOW (ref 150–400)
RBC: 2.95 MIL/uL — AB (ref 4.22–5.81)
RDW: 21.2 % — ABNORMAL HIGH (ref 11.5–15.5)
WBC: 2.2 10*3/uL — ABNORMAL LOW (ref 4.0–10.5)

## 2014-04-08 LAB — PREPARE RBC (CROSSMATCH)

## 2014-04-08 LAB — APTT: APTT: 37 s (ref 24–37)

## 2014-04-08 LAB — MAGNESIUM: MAGNESIUM: 1.9 mg/dL (ref 1.5–2.5)

## 2014-04-08 SURGERY — INSERTION, ENDOVASCULAR STENT GRAFT, AORTA, ABDOMINAL
Anesthesia: General | Site: Abdomen

## 2014-04-08 MED ORDER — CHLORHEXIDINE GLUCONATE 4 % EX LIQD
60.0000 mL | Freq: Once | CUTANEOUS | Status: DC
  Filled 2014-04-08: qty 60

## 2014-04-08 MED ORDER — HEPARIN SODIUM (PORCINE) 1000 UNIT/ML IJ SOLN
INTRAMUSCULAR | Status: AC
  Filled 2014-04-08: qty 1

## 2014-04-08 MED ORDER — EPHEDRINE SULFATE 50 MG/ML IJ SOLN
INTRAMUSCULAR | Status: AC
  Filled 2014-04-08: qty 1

## 2014-04-08 MED ORDER — SODIUM CHLORIDE 0.9 % IJ SOLN
INTRAMUSCULAR | Status: AC
  Filled 2014-04-08: qty 10

## 2014-04-08 MED ORDER — CHLORHEXIDINE GLUCONATE CLOTH 2 % EX PADS: 6.0000 | MEDICATED_PAD | Freq: Once | CUTANEOUS | Status: DC

## 2014-04-08 MED ORDER — LIDOCAINE HCL (CARDIAC) 20 MG/ML IV SOLN
INTRAVENOUS | Status: AC
  Filled 2014-04-08: qty 5

## 2014-04-08 MED ORDER — DOPAMINE-DEXTROSE 3.2-5 MG/ML-% IV SOLN: 3.0000 ug/kg/min | INTRAVENOUS | Status: DC | PRN

## 2014-04-08 MED ORDER — POTASSIUM CHLORIDE CRYS ER 20 MEQ PO TBCR: 20.0000 meq | EXTENDED_RELEASE_TABLET | Freq: Every day | ORAL | Status: DC | PRN

## 2014-04-08 MED ORDER — 0.9 % SODIUM CHLORIDE (POUR BTL) OPTIME
TOPICAL | Status: DC | PRN
  Administered 2014-04-08: 1000 mL

## 2014-04-08 MED ORDER — METOPROLOL TARTRATE 1 MG/ML IV SOLN: 2.0000 mg | INTRAVENOUS | Status: DC | PRN

## 2014-04-08 MED ORDER — HEPARIN SODIUM (PORCINE) 1000 UNIT/ML IJ SOLN
INTRAMUSCULAR | Status: DC | PRN
  Administered 2014-04-08: 1000 [IU] via INTRAVENOUS
  Administered 2014-04-08: 5000 [IU] via INTRAVENOUS

## 2014-04-08 MED ORDER — SODIUM CHLORIDE 0.9 % IR SOLN
Status: DC | PRN
  Administered 2014-04-08: 500 mL

## 2014-04-08 MED ORDER — IODIXANOL 320 MG/ML IV SOLN
INTRAVENOUS | Status: DC | PRN
  Administered 2014-04-08: 60 mL via INTRAVENOUS

## 2014-04-08 MED ORDER — GLYCOPYRROLATE 0.2 MG/ML IJ SOLN
INTRAMUSCULAR | Status: DC | PRN
  Administered 2014-04-08: .4 mg via INTRAVENOUS

## 2014-04-08 MED ORDER — DEXTROSE 5 % IV SOLN
1.5000 g | Freq: Two times a day (BID) | INTRAVENOUS | Status: AC
  Administered 2014-04-08 – 2014-04-09 (×2): 1.5 g via INTRAVENOUS
  Filled 2014-04-08 (×2): qty 1.5

## 2014-04-08 MED ORDER — LACTATED RINGERS IV SOLN
INTRAVENOUS | Status: DC | PRN
  Administered 2014-04-08: 07:00:00 via INTRAVENOUS

## 2014-04-08 MED ORDER — SODIUM CHLORIDE 0.9 % IV SOLN
INTRAVENOUS | Status: DC
  Administered 2014-04-08: 22:00:00 via INTRAVENOUS

## 2014-04-08 MED ORDER — DOXAZOSIN MESYLATE 4 MG PO TABS
4.0000 mg | ORAL_TABLET | Freq: Every day | ORAL | Status: DC
  Filled 2014-04-08 (×2): qty 1

## 2014-04-08 MED ORDER — ONDANSETRON HCL 4 MG/2ML IJ SOLN
INTRAMUSCULAR | Status: AC
  Filled 2014-04-08: qty 2

## 2014-04-08 MED ORDER — FINASTERIDE 5 MG PO TABS
5.0000 mg | ORAL_TABLET | Freq: Every day | ORAL | Status: DC
  Administered 2014-04-08: 5 mg via ORAL
  Filled 2014-04-08 (×2): qty 1

## 2014-04-08 MED ORDER — PNEUMOCOCCAL VAC POLYVALENT 25 MCG/0.5ML IJ INJ
0.5000 mL | INJECTION | INTRAMUSCULAR | Status: DC
  Filled 2014-04-08: qty 0.5

## 2014-04-08 MED ORDER — GUAIFENESIN-DM 100-10 MG/5ML PO SYRP: 15.0000 mL | ORAL_SOLUTION | ORAL | Status: DC | PRN

## 2014-04-08 MED ORDER — FENTANYL CITRATE 0.05 MG/ML IJ SOLN
INTRAMUSCULAR | Status: AC
  Filled 2014-04-08: qty 5

## 2014-04-08 MED ORDER — DEXTROSE 5 % IV SOLN
2.0000 g | INTRAVENOUS | Status: DC | PRN
  Administered 2014-04-08: 1.5 g via INTRAVENOUS

## 2014-04-08 MED ORDER — NEOSTIGMINE METHYLSULFATE 10 MG/10ML IV SOLN
INTRAVENOUS | Status: DC | PRN
  Administered 2014-04-08: 3 mg via INTRAVENOUS

## 2014-04-08 MED ORDER — MORPHINE SULFATE 2 MG/ML IJ SOLN
2.0000 mg | INTRAMUSCULAR | Status: DC | PRN
  Administered 2014-04-08: 4 mg via INTRAVENOUS
  Administered 2014-04-09: 2 mg via INTRAVENOUS
  Filled 2014-04-08: qty 1
  Filled 2014-04-08: qty 2

## 2014-04-08 MED ORDER — SODIUM CHLORIDE 0.9 % IV SOLN: INTRAVENOUS | Status: DC | PRN

## 2014-04-08 MED ORDER — ESMOLOL HCL 10 MG/ML IV SOLN
INTRAVENOUS | Status: DC | PRN
  Administered 2014-04-08: 20 mg via INTRAVENOUS
  Administered 2014-04-08: 10 mg via INTRAVENOUS

## 2014-04-08 MED ORDER — LEVOTHYROXINE SODIUM 137 MCG PO TABS
137.0000 ug | ORAL_TABLET | Freq: Every day | ORAL | Status: DC
  Administered 2014-04-09: 137 ug via ORAL
  Filled 2014-04-08 (×2): qty 1

## 2014-04-08 MED ORDER — ARTIFICIAL TEARS OP OINT
TOPICAL_OINTMENT | OPHTHALMIC | Status: AC
  Filled 2014-04-08: qty 3.5

## 2014-04-08 MED ORDER — CIPROFLOXACIN HCL 500 MG PO TABS
500.0000 mg | ORAL_TABLET | Freq: Two times a day (BID) | ORAL | Status: DC
  Filled 2014-04-08: qty 1

## 2014-04-08 MED ORDER — FENTANYL CITRATE 0.05 MG/ML IJ SOLN
INTRAMUSCULAR | Status: DC | PRN
  Administered 2014-04-08: 200 ug via INTRAVENOUS
  Administered 2014-04-08 (×2): 25 ug via INTRAVENOUS

## 2014-04-08 MED ORDER — PHENOL 1.4 % MT LIQD: 1.0000 | OROMUCOSAL | Status: DC | PRN

## 2014-04-08 MED ORDER — EPHEDRINE SULFATE 50 MG/ML IJ SOLN
INTRAMUSCULAR | Status: DC | PRN
  Administered 2014-04-08 (×2): 5 mg via INTRAVENOUS

## 2014-04-08 MED ORDER — ACETAMINOPHEN 325 MG PO TABS: 325.0000 mg | ORAL_TABLET | ORAL | Status: DC | PRN

## 2014-04-08 MED ORDER — PRAVASTATIN SODIUM 20 MG PO TABS
20.0000 mg | ORAL_TABLET | Freq: Every day | ORAL | Status: DC
Start: 2014-04-08 — End: 2014-04-09
  Administered 2014-04-08: 20 mg via ORAL
  Filled 2014-04-08 (×2): qty 1

## 2014-04-08 MED ORDER — OXYCODONE-ACETAMINOPHEN 5-325 MG PO TABS: 1.0000 | ORAL_TABLET | Freq: Four times a day (QID) | ORAL | Status: DC | PRN

## 2014-04-08 MED ORDER — ROCURONIUM BROMIDE 100 MG/10ML IV SOLN
INTRAVENOUS | Status: DC | PRN
  Administered 2014-04-08: 50 mg via INTRAVENOUS

## 2014-04-08 MED ORDER — ROCURONIUM BROMIDE 50 MG/5ML IV SOLN
INTRAVENOUS | Status: AC
  Filled 2014-04-08: qty 1

## 2014-04-08 MED ORDER — SUCCINYLCHOLINE CHLORIDE 20 MG/ML IJ SOLN
INTRAMUSCULAR | Status: AC
  Filled 2014-04-08: qty 1

## 2014-04-08 MED ORDER — PANTOPRAZOLE SODIUM 40 MG PO TBEC
40.0000 mg | DELAYED_RELEASE_TABLET | Freq: Every day | ORAL | Status: DC
  Administered 2014-04-08: 40 mg via ORAL
  Filled 2014-04-08 (×2): qty 1

## 2014-04-08 MED ORDER — HYDRALAZINE HCL 20 MG/ML IJ SOLN: 5.0000 mg | INTRAMUSCULAR | Status: DC | PRN

## 2014-04-08 MED ORDER — PROPOFOL 10 MG/ML IV BOLUS
INTRAVENOUS | Status: DC | PRN
  Administered 2014-04-08: 80 mg via INTRAVENOUS

## 2014-04-08 MED ORDER — ACETAMINOPHEN 650 MG RE SUPP: 325.0000 mg | RECTAL | Status: DC | PRN

## 2014-04-08 MED ORDER — PROPOFOL 10 MG/ML IV BOLUS
INTRAVENOUS | Status: AC
  Filled 2014-04-08: qty 20

## 2014-04-08 MED ORDER — PHENYLEPHRINE HCL 10 MG/ML IJ SOLN
10.0000 mg | INTRAMUSCULAR | Status: DC | PRN
  Administered 2014-04-08: 10 ug/min via INTRAVENOUS

## 2014-04-08 MED ORDER — BISACODYL 10 MG RE SUPP: 10.0000 mg | Freq: Every day | RECTAL | Status: DC | PRN

## 2014-04-08 MED ORDER — ALUM & MAG HYDROXIDE-SIMETH 200-200-20 MG/5ML PO SUSP: 15.0000 mL | ORAL | Status: DC | PRN

## 2014-04-08 MED ORDER — DOCUSATE SODIUM 100 MG PO CAPS: 100.0000 mg | ORAL_CAPSULE | Freq: Every day | ORAL | Status: DC

## 2014-04-08 MED ORDER — MIDAZOLAM HCL 2 MG/2ML IJ SOLN
INTRAMUSCULAR | Status: AC
  Filled 2014-04-08: qty 2

## 2014-04-08 MED ORDER — ONDANSETRON HCL 4 MG/2ML IJ SOLN
4.0000 mg | Freq: Four times a day (QID) | INTRAMUSCULAR | Status: DC | PRN
  Filled 2014-04-08: qty 2

## 2014-04-08 MED ORDER — LABETALOL HCL 5 MG/ML IV SOLN: 10.0000 mg | INTRAVENOUS | Status: DC | PRN

## 2014-04-08 MED ORDER — LIDOCAINE HCL (CARDIAC) 20 MG/ML IV SOLN
INTRAVENOUS | Status: DC | PRN
  Administered 2014-04-08: 80 mg via INTRAVENOUS

## 2014-04-08 MED ORDER — OXYCODONE HCL 5 MG PO TABS: 5.0000 mg | ORAL_TABLET | ORAL | Status: DC | PRN

## 2014-04-08 MED ORDER — CIPROFLOXACIN HCL 500 MG PO TABS
500.0000 mg | ORAL_TABLET | Freq: Two times a day (BID) | ORAL | Status: DC
  Administered 2014-04-08: 500 mg via ORAL
  Filled 2014-04-08 (×3): qty 1

## 2014-04-08 MED ORDER — ONDANSETRON HCL 4 MG/2ML IJ SOLN
INTRAMUSCULAR | Status: DC | PRN
  Administered 2014-04-08: 4 mg via INTRAVENOUS

## 2014-04-08 MED ORDER — SODIUM CHLORIDE 0.9 % IV SOLN
500.0000 mL | Freq: Once | INTRAVENOUS | Status: AC | PRN
Start: 2014-04-08 — End: 2014-04-08

## 2014-04-08 MED ORDER — ENOXAPARIN SODIUM 40 MG/0.4ML ~~LOC~~ SOLN
40.0000 mg | SUBCUTANEOUS | Status: DC
  Filled 2014-04-08: qty 0.4

## 2014-04-08 MED ORDER — PHENYLEPHRINE 40 MCG/ML (10ML) SYRINGE FOR IV PUSH (FOR BLOOD PRESSURE SUPPORT)
PREFILLED_SYRINGE | INTRAVENOUS | Status: AC
  Filled 2014-04-08: qty 10

## 2014-04-08 MED ORDER — PROTAMINE SULFATE 10 MG/ML IV SOLN
INTRAVENOUS | Status: DC | PRN
  Administered 2014-04-08: 20 mg via INTRAVENOUS
  Administered 2014-04-08: 5 mg via INTRAVENOUS
  Administered 2014-04-08: 25 mg via INTRAVENOUS

## 2014-04-08 MED ORDER — SODIUM CHLORIDE 0.9 % IV SOLN: INTRAVENOUS | Status: DC

## 2014-04-08 SURGICAL SUPPLY — 78 items
BAG BANDED W/RUBBER/TAPE 36X54 (MISCELLANEOUS) ×3 IMPLANT
BAG SNAP BAND KOVER 36X36 (MISCELLANEOUS) ×3 IMPLANT
BENZOIN TINCTURE PRP APPL 2/3 (GAUZE/BANDAGES/DRESSINGS) ×3 IMPLANT
CANISTER SUCTION 2500CC (MISCELLANEOUS) ×3 IMPLANT
CATH BEACON 5.038 65CM KMP-01 (CATHETERS) ×3 IMPLANT
CATH OMNI FLUSH .035X70CM (CATHETERS) ×3 IMPLANT
CLIP LIGATING EXTRA MED SLVR (CLIP) IMPLANT
CLIP LIGATING EXTRA SM BLUE (MISCELLANEOUS) IMPLANT
CLOSURE WOUND 1/2 X4 (GAUZE/BANDAGES/DRESSINGS) ×1
COVER DOME SNAP 22 D (MISCELLANEOUS) ×3 IMPLANT
COVER MAYO STAND STRL (DRAPES) ×3 IMPLANT
COVER PROBE W GEL 5X96 (DRAPES) ×3 IMPLANT
COVER SURGICAL LIGHT HANDLE (MISCELLANEOUS) ×3 IMPLANT
DEVICE CLOSURE PERCLS PRGLD 6F (VASCULAR PRODUCTS) ×4 IMPLANT
DRAPE TABLE COVER HEAVY DUTY (DRAPES) ×3 IMPLANT
DRSG TEGADERM 2-3/8X2-3/4 SM (GAUZE/BANDAGES/DRESSINGS) ×3 IMPLANT
DRYSEAL FLEXSHEATH 12FR 33CM (SHEATH) ×2
DRYSEAL FLEXSHEATH 18FR 33CM (SHEATH) ×2
ELECT CAUTERY BLADE 6.4 (BLADE) IMPLANT
ELECT REM PT RETURN 9FT ADLT (ELECTROSURGICAL) ×6
ELECTRODE REM PT RTRN 9FT ADLT (ELECTROSURGICAL) ×2 IMPLANT
EXCLUDER TNK 28X14.5MMX12CM (Endovascular Graft) ×1 IMPLANT
EXCLUDER TRUNK 28X14.5MMX12CM (Endovascular Graft) ×3 IMPLANT
GAUZE SPONGE 2X2 8PLY STRL LF (GAUZE/BANDAGES/DRESSINGS) ×1 IMPLANT
GLOVE BIO SURGEON STRL SZ 6.5 (GLOVE) ×2 IMPLANT
GLOVE BIO SURGEONS STRL SZ 6.5 (GLOVE) ×1
GLOVE BIOGEL PI IND STRL 6.5 (GLOVE) ×3 IMPLANT
GLOVE BIOGEL PI IND STRL 8 (GLOVE) ×2 IMPLANT
GLOVE BIOGEL PI INDICATOR 6.5 (GLOVE) ×6
GLOVE BIOGEL PI INDICATOR 8 (GLOVE) ×4
GLOVE ECLIPSE 7.5 STRL STRAW (GLOVE) ×3 IMPLANT
GLOVE SS BIOGEL STRL SZ 7.5 (GLOVE) ×1 IMPLANT
GLOVE SUPERSENSE BIOGEL SZ 7.5 (GLOVE) ×2
GOWN STRL REUS W/ TWL LRG LVL3 (GOWN DISPOSABLE) ×2 IMPLANT
GOWN STRL REUS W/ TWL XL LVL3 (GOWN DISPOSABLE) ×2 IMPLANT
GOWN STRL REUS W/TWL LRG LVL3 (GOWN DISPOSABLE) ×4
GOWN STRL REUS W/TWL XL LVL3 (GOWN DISPOSABLE) ×4
GRAFT BALLN CATH 65CM (STENTS) ×1 IMPLANT
KIT BASIN OR (CUSTOM PROCEDURE TRAY) ×3 IMPLANT
KIT ROOM TURNOVER OR (KITS) ×3 IMPLANT
LEG CONTRALATERAL 16X18X9.5 (Endovascular Graft) ×3 IMPLANT
LEG CONTRALATERAL 20X11.5 (Vascular Products) ×2 IMPLANT
NEEDLE PERC 18GX7CM (NEEDLE) ×3 IMPLANT
NS IRRIG 1000ML POUR BTL (IV SOLUTION) ×3 IMPLANT
PACK AORTA (CUSTOM PROCEDURE TRAY) ×3 IMPLANT
PAD ARMBOARD 7.5X6 YLW CONV (MISCELLANEOUS) ×6 IMPLANT
PENCIL BUTTON HOLSTER BLD 10FT (ELECTRODE) IMPLANT
PERCLOSE PROGLIDE 6F (VASCULAR PRODUCTS) ×12
PROTECTION STATION PRESSURIZED (MISCELLANEOUS) ×3
SHEATH AVANTI 11CM 8FR (MISCELLANEOUS) ×3 IMPLANT
SHEATH BRITE TIP 8FR 23CM (MISCELLANEOUS) ×3 IMPLANT
SHEATH DRYSEAL FLEX 12FR 33CM (SHEATH) ×1 IMPLANT
SHEATH DRYSEAL FLEX 18FR 33CM (SHEATH) ×1 IMPLANT
SPONGE GAUZE 2X2 STER 10/PKG (GAUZE/BANDAGES/DRESSINGS) ×2
STAPLER VISISTAT 35W (STAPLE) IMPLANT
STATION PROTECTION PRESSURIZED (MISCELLANEOUS) ×1 IMPLANT
STENT GRAFT BALLN CATH 65CM (STENTS) ×2
STENT GRAFT CONTRALAT 20X11.5 (Vascular Products) ×1 IMPLANT
STOPCOCK MORSE 400PSI 3WAY (MISCELLANEOUS) ×3 IMPLANT
STRIP CLOSURE SKIN 1/2X4 (GAUZE/BANDAGES/DRESSINGS) ×2 IMPLANT
SUT ETHILON 3 0 PS 1 (SUTURE) IMPLANT
SUT PROLENE 5 0 C 1 24 (SUTURE) IMPLANT
SUT VIC AB 2-0 CTX 36 (SUTURE) IMPLANT
SUT VIC AB 3-0 SH 18 (SUTURE) IMPLANT
SUT VIC AB 3-0 SH 27 (SUTURE)
SUT VIC AB 3-0 SH 27X BRD (SUTURE) IMPLANT
SUT VICRYL 4-0 PS2 18IN ABS (SUTURE) ×6 IMPLANT
SYR 20CC LL (SYRINGE) ×6 IMPLANT
SYR 30ML LL (SYRINGE) IMPLANT
SYR 5ML LL (SYRINGE) ×3 IMPLANT
SYR MEDRAD MARK V 150ML (SYRINGE) ×3 IMPLANT
SYRINGE 10CC LL (SYRINGE) ×9 IMPLANT
TOWEL OR 17X24 6PK STRL BLUE (TOWEL DISPOSABLE) IMPLANT
TOWEL OR 17X26 10 PK STRL BLUE (TOWEL DISPOSABLE) IMPLANT
TRAY FOLEY CATH 16FRSI W/METER (SET/KITS/TRAYS/PACK) ×3 IMPLANT
TUBING HIGH PRESSURE 120CM (CONNECTOR) ×3 IMPLANT
WIRE AMPLATZ SS-J .035X180CM (WIRE) ×6 IMPLANT
WIRE BENTSON .035X145CM (WIRE) ×6 IMPLANT

## 2014-04-08 NOTE — Progress Notes (Signed)
  [facility] Progress Note  04/08/2014 4:34 PM Day of Surgery  Subjective:  No complaints.   Tmax 98.1 BP sys 90s-150s 02 93% 4L  Filed Vitals:   04/08/14 1552  BP: 108/55  Pulse: 75  Temp: 98.1 F (36.7 C)  Resp: 14    Physical Exam: Incisions:  Bilateral groins soft without hematoma Extremities: 2+ dorsalis pedis pulses bilaterally  Cardiac: regular rate and rhythm, no murmurs  Lungs: clear to auscultation bilaterally  CBC    Component Value Date/Time   WBC 2.2* 04/08/2014 0937   WBC 1.6* 04/06/2014 0957   RBC 2.95* 04/08/2014 0937   RBC 3.30* 04/06/2014 0957   RBC 2.89* 02/08/2014 1315   HGB 10.3* 04/08/2014 0937   HGB 12.0* 04/06/2014 0957   HCT 31.5* 04/08/2014 0937   HCT 35.5* 04/06/2014 0957   PLT 85* 04/08/2014 0937   PLT 105* 04/06/2014 0957   MCV 106.8* 04/08/2014 0937   MCV 108* 04/06/2014 0957   MCH 34.9* 04/08/2014 0937   MCH 36.4* 04/06/2014 0957   MCHC 32.7 04/08/2014 0937   MCHC 33.8 04/06/2014 0957   RDW 21.2* 04/08/2014 0937   RDW 20.9* 04/06/2014 0957    BMET    Component Value Date/Time   NA 141 04/08/2014 0937   NA 141 02/08/2014 1315   K 4.2 04/08/2014 0937   K 3.5 02/08/2014 1315   CL 105 04/08/2014 0937   CL 103 02/08/2014 1315   CO2 20 04/08/2014 0937   CO2 26 02/08/2014 1315   GLUCOSE 112* 04/08/2014 0937   GLUCOSE 105 02/08/2014 1315   BUN 13 04/08/2014 0937   BUN 11 02/08/2014 1315   CREATININE 0.71 04/08/2014 0937   CREATININE 0.9 02/08/2014 1315   CALCIUM 8.3* 04/08/2014 0937   CALCIUM 8.5 02/08/2014 1315   GFRNONAA 87* 04/08/2014 0937   GFRAA >90 04/08/2014 0937    INR    Component Value Date/Time   INR 1.30 04/08/2014 0937     Intake/Output Summary (Last 24 hours) at 04/08/14 1634 Last data filed at 04/08/14 1415  Gross per 24 hour  Intake   1900 ml  Output   1380 ml  Net    520 ml     Assessment:  80 y.o. male is s/p: EVAR Day of Surgery  Plan: -Doing well  post-operatively. 2+ DP pulses bilaterally. Groins soft. BP stable.  -Advance diet as tolerated. -Mobilize. OOB.  -D/C foley tomorrow -DVT prophylaxis:  Lovenox -Anticipate discharge tomorrow.    Virgina Jock, PA-C [facility] Office: [phone] Pager: [phone] 04/08/2014 4:34 PM

## 2014-04-08 NOTE — Progress Notes (Signed)
UR completed 

## 2014-04-08 NOTE — Op Note (Signed)
OPERATIVE REPORT  DATE OF SURGERY: 04/08/2014  PATIENT: Andrew [name], 80 y.o. male MRN: [id number]  DOB: Oct 29, 1932  PRE-OPERATIVE DIAGNOSIS: abdominal aortic aneurysm  POST-OPERATIVE DIAGNOSIS:  Same  PROCEDURE: [name] excluder stent graft repair of abdominal aortic aneurysm  SURGEON:  Curt Jews, M.D.  PHYSICIAN ASSISTANT: Trinh  ANESTHESIA:  general  EBL: 100 ml  Total I/O In: 1600 [I.V.:1600] Out: 455 [Urine:405; Blood:50]  BLOOD ADMINISTERED: none  DRAINS: none  SPECIMEN: none  COUNTS CORRECT:  YES  PLAN OF CARE: PACU stable   PATIENT DISPOSITION:  PACU - hemodynamically stable  PROCEDURE DETAILS: The patient was taken to the operative placed above position where the area both groins and abdomen were prepped and draped in usual sterile fashion. Using SonoSite ultrasound and a double Perclose technique the right and left common femoral arteries were accessed with 18-gauge needles the guidewire was passed centrally. Perclose devices were positioned partially deployed at the 2:00 and 4:00 position bilaterally. A short 8 French sheath was passed over the right guidewire and the left with the 8 [place] sheath as well. A Kumpe catheter was used to place a Bentson wire and the suprarenal aorta on the left and the 8 [place] was exchanged after placing an Amplatz Super Stiff wire through this Kumpe catheter. The 12 French sheath was passed over the Amplatz superstiff wire. A marker pigtail catheter was positioned through the left sheath to the suprarenal aorta. Next the Bentson wire was exchanged for a superstiff wire on the right.  A 18 French sheath was passed over the right groin. The main body of the device was placed with a reversed leg configuration. This was a 28 x 14 x 12 cm device. The imaging was obtained at 15 LAO and 15 cranial to give good visualization of the renal artery takeoffs. Arteriogram showed that the right renal artery was slightly lower than the left  renal artery takeoff. The device was deployed at the level of the renal arteries. Repeat injection to the pigtail sheath showed that there was excellent positioning superiorly. Pigtail catheter was then withdrawn down to the sac. Amplatz superstiff wire was left in place the pigtail catheter was removed and a Kumpe catheter was positioned as a buddy wire technique by the Amplatz wire through the left sheath. Bentson wire was used to easily cannulate the contralateral gate and this was confirmed by twisting a pigtail catheter in the main body which showed that there was cannulation of the gate. The contralateral limb was a 20 cm x 11.5 cm. Retrograde injection through the left sheath revealed the level of the hypogastric takeoff. The contralateral limb was deployed just above the takeoff of the hypogastric artery. Finally the main body was completely deployed by releasing the limb. A retrograde injection was obtained with a slight LAO projection showing the level of the hypogastric takeoff on the right. A 18 x 9.5 cm extension was used plan just above the takeoff of the hypogastric artery. All junctions were angioplastied with a 250 balloon and the superior inferior attachment sites were antiplastic as well. The pigtail catheter positioned above the level of the renal arteries and a final injection showed excellent positioning with no evidence of type I endoleak. There were late lumbar refilling at the level of the bifurcation. The Amplatz wires were exchanged for Bentson wires bilaterally. The Perclose devices were deployed on the right and left groin and this gave good hemostasis. The patient were pulled and the deployment was completed and  the Perclose bilaterally. Sterile dressings were applied. The patient had 2+ dorsalis pedis pulses bilaterally. Patient was transferred to the recovery room in stable condition. The patient had been given600u heparin separate prior to placement of the large sheaths. The patient  was given 50 mg of protamine at the completion of the case   Curt Jews, M.D. 04/08/2014 9:35 AM

## 2014-04-08 NOTE — Anesthesia Preprocedure Evaluation (Signed)
Anesthesia Evaluation  Patient identified by MRN, date of birth, ID band Patient awake    Reviewed: Allergy & Precautions, H&P , NPO status , Patient's Chart, lab work & pertinent test results  Airway Mallampati: I  TM Distance: >3 FB Neck ROM: Full    Dental   Pulmonary former smoker,          Cardiovascular + Peripheral Vascular Disease     Neuro/Psych    GI/Hepatic   Endo/Other  Hypothyroidism   Renal/GU CRFRenal disease     Musculoskeletal   Abdominal   Peds  Hematology   Anesthesia Other Findings   Reproductive/Obstetrics                             Anesthesia Physical Anesthesia Plan  ASA: III  Anesthesia Plan: General   Post-op Pain Management:    Induction: Intravenous  Airway Management Planned: Oral ETT  Additional Equipment: Arterial line  Intra-op Plan:   Post-operative Plan: Extubation in OR  Informed Consent: I have reviewed the patients History and Physical, chart, labs and discussed the procedure including the risks, benefits and alternatives for the proposed anesthesia with the patient or authorized representative who has indicated his/her understanding and acceptance.     Plan Discussed with: CRNA and Surgeon  Anesthesia Plan Comments:         Anesthesia Quick Evaluation

## 2014-04-08 NOTE — H&P (View-Only) (Signed)
Patient name: Andrew [name] MRN: [id number] DOB: 16-Jul-1932 Sex: male      Reason for referral:  Chief Complaint  Patient presents with  . AAA    f/u  CT prior    HISTORY OF PRESENT ILLNESS: Today for discussion of CT angiogram of his aortic aneurysm. We'll see in approximately one month ago with ultrasound showing progression in size. He has no symptoms are related to his aneurysm. Did just return from a vacation in [place].  Past Medical History  Diagnosis Date  . Leg pain   . AAA (abdominal aortic aneurysm)   . Foot pain     while lying flat  . Thyroid disease   . Hereditary hemochromatosis 12/24/2013  . MDS (myelodysplastic syndrome), low grade 12/24/2013  . Anemia     Past Surgical History  Procedure Laterality Date  . Spine surgery  04/16/2006  . Eye surgery  July 2013    Cataract Right eye  . Eye surgery  Aug. 2013    Cataract Left eye    History   Social History  . Marital Status: Married    Spouse Name: N/A    Number of Children: N/A  . Years of Education: N/A   Occupational History  . Not on file.   Social History Main Topics  . Smoking status: Former Smoker -- 2.00 packs/day for 25 years    Types: Cigarettes    Start date: 07/01/1960    Quit date: 06/03/1985  . Smokeless tobacco: Never Used     Comment: quit smoking 26 years ago  . Alcohol Use: No  . Drug Use: No  . Sexual Activity: Not on file   Other Topics Concern  . Not on file   Social History Narrative  . No narrative on file    Family History  Problem Relation Age of Onset  . Cancer Mother   . Diabetes Mother   . Cancer Brother     Allergies as of 03/15/2014  . (No Known Allergies)    Current Outpatient Prescriptions on File Prior to Visit  Medication Sig Dispense Refill  . Bisacodyl (LAXATIVE PO) Take by mouth as needed.        . doxazosin (CARDURA) 4 MG tablet Take 4 mg by mouth at bedtime.        . finasteride (PROSCAR) 5 MG tablet Take 5 mg by mouth daily.         Marland Kitchen gabapentin (NEURONTIN) 100 MG capsule PT REFUSED TO TAKE D/T SIDE EFFECTS      . levothyroxine (SYNTHROID) 137 MCG tablet Take 137 mcg by mouth daily before breakfast.      . lovastatin (MEVACOR) 20 MG tablet Take 20 mg by mouth at bedtime.        No current facility-administered medications on file prior to visit.        PHYSICAL EXAMINATION:  General: The patient is a well-nourished male, in no acute distress. Vital signs are BP 115/61  Pulse 92  Resp 18  Ht 5' 10.5" (1.791 m)  Wt 200 lb (90.719 kg)  BMI 28.28 kg/m2 Pulmonary: There is a good air exchange  Abdomen: Soft and non-tender. I cannot palpate an aneurysm Musculoskeletal: There are no major deformities.  There is no significant extremity pain. Neurologic: Does have bilateral [place] weakness due to old back injury Skin: There are no ulcer or rashes noted. Psychiatric: The patient has normal affect. Cardiovascular: Easily palpable femoral popliteal and posterior tibial pulses bilaterally  with no evidence of peripheral aneurysm  CT today shows 6.1 cm infrarenal aneurysm. He does have a long infrarenal neck and landing zones in the iliac arteries bilaterally. Normal caliber arteries for access to the aorta for stent graft.  Impression and Plan:  6.1 cm infrarenal abdominal aortic aneurysm. Have recommended elective repair. Explained a stent graft procedure. He does have ideal anatomy for this. We'll get preoperative cardiac clearance. Does have any cardiac history. Also will discuss with Dr.Ennever regarding his hemachromatosis to determine if there is any cautions take around the time of surgery    Raksha Wolfgang [facility] Office: [phone]

## 2014-04-08 NOTE — Anesthesia Postprocedure Evaluation (Signed)
Anesthesia Post Note  Patient: Andrew [name]  Procedure(s) Performed: Procedure(s) (LRB): ABDOMINAL AORTIC ENDOVASCULAR STENT GRAFT (N/A)  Anesthesia type: general  Patient location: PACU  Post pain: Pain level controlled  Post assessment: Patient's Cardiovascular Status Stable  Last Vitals:  Filed Vitals:   04/08/14 1112  BP:   Pulse:   Temp: 36.3 C  Resp:     Post vital signs: Reviewed and stable  Level of consciousness: sedated  Complications: No apparent anesthesia complications

## 2014-04-08 NOTE — Transfer of Care (Signed)
Immediate Anesthesia Transfer of Care Note  Patient: Andrew [name]  Procedure(s) Performed: Procedure(s): ABDOMINAL AORTIC ENDOVASCULAR STENT GRAFT (N/A)  Patient Location: PACU  Anesthesia Type:General  Level of Consciousness: awake and alert   Airway & Oxygen Therapy: Patient Spontanous Breathing and Patient connected to nasal cannula oxygen  Post-op Assessment: Report given to PACU RN and Post -op Vital signs reviewed and stable  Post vital signs: Reviewed and stable  Complications: No apparent anesthesia complications

## 2014-04-08 NOTE — Interval H&P Note (Signed)
History and Physical Interval Note:  04/08/2014 7:09 AM  Andrew [name]  has presented today for surgery, with the diagnosis of Aortic aneurysm, abdominal   The various methods of treatment have been discussed with the patient and family. After consideration of risks, benefits and other options for treatment, the patient has consented to  Procedure(s): ABDOMINAL AORTIC ENDOVASCULAR STENT GRAFT (N/A) as a surgical intervention .  The patient's history has been reviewed, patient examined, no change in status, stable for surgery.  I have reviewed the patient's chart and labs.  Questions were answered to the patient's satisfaction.     EARLY, TODD

## 2014-04-08 NOTE — Anesthesia Procedure Notes (Signed)
Procedure Name: Intubation Date/Time: 04/08/2014 8:00 AM Performed by: Garner Nash Pre-anesthesia Checklist: Patient identified, Emergency Drugs available, Suction available, Patient being monitored and Timeout performed Patient Re-evaluated:Patient Re-evaluated prior to inductionOxygen Delivery Method: Circle system utilized Preoxygenation: Pre-oxygenation with 100% oxygen Intubation Type: IV induction Ventilation: Mask ventilation without difficulty and Oral airway inserted - appropriate to patient size Laryngoscope Size: Mac and 4 Grade View: Grade I Tube type: Oral Tube size: 7.5 mm Number of attempts: 1 Airway Equipment and Method: Stylet Placement Confirmation: ETT inserted through vocal cords under direct vision,  positive ETCO2,  CO2 detector and breath sounds checked- equal and bilateral Secured at: 21 cm Tube secured with: Tape Dental Injury: Teeth and Oropharynx as per pre-operative assessment

## 2014-04-09 LAB — BASIC METABOLIC PANEL
Anion gap: 10 (ref 5–15)
BUN: 9 mg/dL (ref 6–23)
CHLORIDE: 103 meq/L (ref 96–112)
CO2: 24 mEq/L (ref 19–32)
Calcium: 8.1 mg/dL — ABNORMAL LOW (ref 8.4–10.5)
Creatinine, Ser: 0.76 mg/dL (ref 0.50–1.35)
GFR calc non Af Amer: 85 mL/min — ABNORMAL LOW (ref >90)
Glucose, Bld: 115 mg/dL — ABNORMAL HIGH (ref 70–99)
POTASSIUM: 3.8 meq/L (ref 3.7–5.3)
SODIUM: 137 meq/L (ref 137–147)

## 2014-04-09 LAB — CBC
HCT: 29.9 % — ABNORMAL LOW (ref 39.0–52.0)
HEMOGLOBIN: 9.7 g/dL — AB (ref 13.0–17.0)
MCH: 34.5 pg — ABNORMAL HIGH (ref 26.0–34.0)
MCHC: 32.4 g/dL (ref 30.0–36.0)
MCV: 106.4 fL — ABNORMAL HIGH (ref 78.0–100.0)
Platelets: 84 10*3/uL — ABNORMAL LOW (ref 150–400)
RBC: 2.81 MIL/uL — ABNORMAL LOW (ref 4.22–5.81)
RDW: 21.5 % — AB (ref 11.5–15.5)
WBC: 1.8 10*3/uL — AB (ref 4.0–10.5)

## 2014-04-09 MED ORDER — OXYCODONE-ACETAMINOPHEN 5-325 MG PO TABS: 1.0000 | ORAL_TABLET | Freq: Four times a day (QID) | ORAL | Status: DC | PRN

## 2014-04-09 MED ORDER — CETYLPYRIDINIUM CHLORIDE 0.05 % MT LIQD: 7.0000 mL | Freq: Two times a day (BID) | OROMUCOSAL | Status: DC

## 2014-04-09 NOTE — Plan of Care (Signed)
Problem: Phase II Discharge Progression Outcomes Goal: Tolerating diet Outcome: Completed/Met Date Met:  04/09/14

## 2014-04-09 NOTE — Plan of Care (Signed)
Problem: Phase II Discharge Progression Outcomes Goal: O2 Saturation > or equal to 90% on room air Outcome: Completed/Met Date Met:  04/09/14

## 2014-04-09 NOTE — Plan of Care (Signed)
Problem: Phase II Discharge Progression Outcomes Goal: Wound healing without s/s of infection Outcome: Completed/Met Date Met:  04/09/14     

## 2014-04-09 NOTE — Progress Notes (Signed)
Discharge instructions provided, pt and wife verbalized understanding. Pt stable, son driving patient home Cheryln Manly

## 2014-04-09 NOTE — Progress Notes (Signed)
Subjective: Interval History: none..sitting up in chair. Wife and son present. Complaining about not having bowel movements which is chronic and doesn't like the food in the hospital. Denies any groin pain. Does complain of pain from radial artery access site.   Objective: Vital signs in last 24 hours: Temp:  [97.2 F (36.2 C)-98.6 F (37 C)] 98.6 F (37 C) (11/07 0729) Pulse Rate:  [40-98] 40 (11/07 0729) Resp:  [12-24] 16 (11/07 0729) BP: (99-157)/(42-96) 110/59 mmHg (11/07 0729) SpO2:  [88 %-100 %] 98 % (11/07 0729) Arterial Line BP: (99-156)/(43-64) 135/51 mmHg (11/07 0500)  Intake/Output from previous day: 11/06 0701 - 11/07 0700 In: 3480 [P.O.:480; I.V.:2900; IV Piggyback:100] Out: 5430 [Urine:5380; Blood:50] Intake/Output this shift: Total I/O In: -  Out: 1000 [Urine:1000]  both groins without hematoma and minimal bruising. 2+ dorsalis pedis pulses bilaterally. Right radial arterial line without hematoma with dressing intact. Hand is well-perfused.  Lab Results:  Recent Labs  04/08/14 0937 04/09/14 0450  WBC 2.2* 1.8*  HGB 10.3* 9.7*  HCT 31.5* 29.9*  PLT 85* 84*   BMET  Recent Labs  04/08/14 0937 04/09/14 0450  NA 141 137  K 4.2 3.8  CL 105 103  CO2 20 24  GLUCOSE 112* 115*  BUN 13 9  CREATININE 0.71 0.76  CALCIUM 8.3* 8.1*    Studies/Results: Dg Chest Port 1 View  04/08/2014   CLINICAL DATA:  Status post abdominal aortic aneurysm repair.  EXAM: PORTABLE CHEST - 1 VIEW  COMPARISON:  May 13, 2008  FINDINGS: There is interstitial edema bilaterally. There is some mild patchy alveolar consolidation in each base. There is some superimposed scarring in the left base as well. Heart is upper normal in size. The pulmonary vascularity is normal. No adenopathy. Aorta is prominent but stable. There is postoperative change in the lower thoracic and visualized upper lumbar spine.  IMPRESSION: Bilateral interstitial edema. Suspect a degree of congestive heart  failure. Probable mild alveolar edema in the bases. Scarring left base persists.   Electronically Signed   By: Lowella Grip M.D.   On: 04/08/2014 10:20   Ct Angio Abd/pel W/ And/or W/o  03/15/2014   CLINICAL DATA:  Abdominal aortic aneurysm. Preoperative imaging prior to planned endovascular versus open repair.  EXAM: CT ANGIOGRAPHY ABDOMEN AND PELVIS WITH CONTRAST AND WITHOUT CONTRAST  TECHNIQUE: Multidetector CT imaging of the abdomen and pelvis was performed using the standard protocol during bolus administration of intravenous contrast. Multiplanar reconstructed images including MIPs were obtained and reviewed to evaluate the vascular anatomy.  CONTRAST:  67mL OMNIPAQUE IOHEXOL 350 MG/ML SOLN  COMPARISON:  CT of the abdomen and pelvis on 09/01/2013 performed at [facility].  FINDINGS: Arterial findings  Aorta  There is some interval enlargement of the distal abdominal aortic aneurysm since the prior CT study. Aneurysmal dilatation again begins immediately inferior to the IMA origin and continues to the bifurcation. Current maximal transverse dimensions are 6.0 x 6.1 cm. No associated evidence of rupture or dissection. There remains an elongated infrarenal neck above the level of aneurysmal dilatation which measures approximately 2.5 cm in diameter.  Celiac axis  Minimal plaque at origin without stenosis.  Superior mesenteric  Normally patent.  Left renal  Single left renal artery shows normal patency.  Right renal  Single right renal artery shows normal patency.  Inferior mesenteric  Normally patent.  Left iliac  The left common iliac artery measures 14 mm at its origin. The distal common iliac artery shows slight dilatation  up to 16 mm in diameter. Internal and external iliac arteries show normal patency. The common femoral artery shows minimal plaque without evidence of stenosis.  Right iliac  The right common iliac artery measures 15 mm in diameter at its origin. The distal common iliac artery  measures 17 mm. Internal and external iliac arteries as well as the common femoral arteries show minimal plaque and no evidence of significant stenosis.  Venous findings  Delayed imaging shows no evidence of venous abnormalities. The renal veins are patent bilaterally. No evidence of aortocaval fistula.  Review of the MIP images confirms the above findings.  Nonvascular findings  Degree left-sided hydronephrosis and hydroureter is more pronounced compared to the prior study. Delayed imaging does not show any delay in excretion of contrast into the collecting system. The etiology of left hydronephrosis and hydroureter are not obvious by CT with ureteral dilatation seen all the way to the level of the bladder. Recommend correlation with any previous urologic workup.  The right kidney is unremarkable. The liver, gallbladder, pancreas, spleen and adrenal glands are within normal limits. Bowel loops show no dilatation. No abnormal fluid collections are seen. Stable renal cysts. Tiny umbilical hernia contains fat. Stable evidence of prior extensive thoracolumbar fusion and chronic fracture of the T12 vertebral body.  Visualized lung bases show chronic fibrotic changes as well as multiple subcentimeter bilateral pulmonary nodules ranging in size from 4 mm up to 9 mm. These have an appearance likely of postinflammatory nodules. However, growth cannot be fully evaluated follow-up CT of the chest is recommended in 3-6 months.  IMPRESSION: 1. Enlarging infrarenal abdominal aortic aneurysm which begins just below the origin of the IMA. Maximal aneurysm diameter is now 6.1 cm. No evidence of rupture or dissection. 2. Mild fusiform dilatation of the distal common iliac arteries without focal iliac aneurysm. 3. More prominent appearance of left-sided hydronephrosis and hydroureter. Etiology is not obvious by CT. There is no delay in excretion of contrast into the left renal collecting system. Recommend correlation with prior  urologic workup. 4. Chronic lung disease with multiple subcentimeter pulmonary nodules at the lung bases. These may be postinflammatory. Recommend followup CT of the entire chest in 3-6 months.   Electronically Signed   By: Aletta Edouard M.D.   On: 03/15/2014 13:04   Anti-infectives: Anti-infectives    Start     Dose/Rate Route Frequency Ordered Stop   04/08/14 2200  ciprofloxacin (CIPRO) tablet 500 mg  Status:  Discontinued     500 mg Oral 2 times daily 04/08/14 1224 04/08/14 1309   04/08/14 2200  ciprofloxacin (CIPRO) tablet 500 mg     500 mg Oral 2 times daily 04/08/14 1309 04/12/14 2359   04/08/14 1800  cefUROXime (ZINACEF) 1.5 g in dextrose 5 % 50 mL IVPB     1.5 g100 mL/hr over 30 Minutes Intravenous Every 12 hours 04/08/14 1224 04/09/14 0619   04/07/14 1439  cefUROXime (ZINACEF) 1.5 g in dextrose 5 % 50 mL IVPB  Status:  Discontinued     1.5 g100 mL/hr over 30 Minutes Intravenous 30 min pre-op 04/07/14 1439 04/08/14 1126      Assessment/Plan: s/p Procedure(s): ABDOMINAL AORTIC ENDOVASCULAR STENT GRAFT (N/A) stable for discharge to home. Will DC Foley. Patient self or 5 times per day. We'll see him in 1 month with CT scan   LOS: 1 day   Guida Asman 04/09/2014, 8:47 AM

## 2014-04-09 NOTE — Plan of Care (Signed)
Problem: Phase I Progression Outcomes Goal: Neurologically stable/at baseline (CEA/CES) Outcome: Completed/Met Date Met:  04/09/14 Goal: Peripheral pulses at baseline (AAA) Outcome: Completed/Met Date Met:  04/09/14 Goal: Vascular site scale level 0 - I Vascular Site Scale Level 0: No bruising/bleeding/hematoma Level I (Mild): Bruising/Ecchymosis, minimal bleeding/ooozing, palpable hematoma < 3 cm Level II (Moderate): Bleeding not affecting hemodynamic parameters, pseudoaneurysm, palpable hematoma > 3 cm Level III (Severe) Bleeding which affects hemodynamic parameters or retroperitoneal hemorrhage  Outcome: Progressing Goal: Pain controlled with appropriate interventions Outcome: Progressing Goal: Activity progression as ordered Outcome: Completed/Met Date Met:  04/09/14 Goal: If Diabetic, blood sugar < 150 Outcome: Not Applicable Date Met:  88/75/79

## 2014-04-09 NOTE — Plan of Care (Signed)
Problem: Phase I Progression Outcomes Goal: Pain controlled with appropriate interventions Outcome: Completed/Met Date Met:  04/09/14

## 2014-04-09 NOTE — Plan of Care (Signed)
Problem: Phase I Progression Outcomes Goal: Vascular site scale level 0 - I Vascular Site Scale Level 0: No bruising/bleeding/hematoma Level I (Mild): Bruising/Ecchymosis, minimal bleeding/ooozing, palpable hematoma < 3 cm Level II (Moderate): Bleeding not affecting hemodynamic parameters, pseudoaneurysm, palpable hematoma > 3 cm Level III (Severe) Bleeding which affects hemodynamic parameters or retroperitoneal hemorrhage  Outcome: Completed/Met Date Met:  04/09/14

## 2014-04-11 LAB — TYPE AND SCREEN
ABO/RH(D): O POS
Antibody Screen: NEGATIVE
Unit division: 0
Unit division: 0

## 2014-04-11 NOTE — Care Management Note (Signed)
    Page 1 of 1   04/11/2014     10:59:26 AM CARE MANAGEMENT NOTE 04/11/2014  Patient:  Andrew [name], Andrew [name]   Account Number:  [id number]  Date Initiated:  04/08/2014  Documentation initiated by:  McNally,Kristin  Subjective/Objective Assessment:   pt from home for elective aaa repair     Action/Plan:   Anticipated dc home with no needs   Anticipated DC Date:  04/09/2014   Anticipated DC Plan:  HOME/SELF CARE         Choice offered to / List presented to:             Status of service:  Completed, signed off Medicare Important Message given?  NA - LOS <3 / Initial given by admissions (If response is "NO", the following Medicare IM given date fields will be blank) Date Medicare IM given:   Medicare IM given by:   Date Additional Medicare IM given:   Additional Medicare IM given by:    Discharge Disposition:  HOME/SELF CARE  Per UR Regulation:  Reviewed for med. necessity/level of care/duration of stay  If discussed at [place] of Stay Meetings, dates discussed:    Comments:

## 2014-04-12 ENCOUNTER — Encounter (HOSPITAL_COMMUNITY): Payer: Self-pay | Admitting: Vascular Surgery

## 2014-04-12 ENCOUNTER — Telehealth: Payer: Self-pay | Admitting: Vascular Surgery

## 2014-04-12 NOTE — Telephone Encounter (Signed)
-----   Message from Sherrye Payor, RN sent at 04/08/2014  9:56 AM EST ----- Regarding: Zigmund Daniel log; Appt needed w/ CTA 4 wks   ----- Message -----    From: Alvia Grove, PA-C    Sent: 04/08/2014   9:20 AM      To: [facility] Charge Pool  S/p evar 04/08/14  F/u with Dr. Donnetta Hutching in 4 weeks with CTA.   Thanks, Maudie Mercury

## 2014-04-14 ENCOUNTER — Ambulatory Visit: Payer: Medicare Other | Admitting: Cardiovascular Disease

## 2014-04-14 NOTE — Discharge Summary (Signed)
[facility] Discharge Summary   Patient ID:  Andrew [name] MRN: [id number] DOB/AGE: 1932/08/09 80 y.o.  Admit date: 04/08/2014 Discharge date: 04/14/2014 Date of Surgery: 04/08/2014 Surgeon: Surgeon(s): Rosetta Posner, MD  Admission Diagnosis: Aortic aneurysm, abdominal   Discharge Diagnoses:  Aortic aneurysm, abdominal   Secondary Diagnoses: Past Medical History  Diagnosis Date  . Leg pain   . AAA (abdominal aortic aneurysm)   . Foot pain     while lying flat  . Thyroid disease   . Hereditary hemochromatosis 12/24/2013  . MDS (myelodysplastic syndrome), low grade 12/24/2013  . Anemia   . Hypothyroidism   . Depression   . Chronic kidney disease     has to self catherize    Procedure(s): ABDOMINAL AORTIC ENDOVASCULAR STENT GRAFT  Discharged Condition: good  HPI: 80 y/o male AAA He has no symptoms are related to his aneurysm.  He has been followed over the past several years and is now grown to the size of 6.1 cm.    Hospital Course:  Andrew [name] is a 80 y.o. male is S/P  Procedure(s): ABDOMINAL AORTIC ENDOVASCULAR STENT GRAFT -Doing well post-operatively. 2+ DP pulses bilaterally. Groins soft. BP stable. He is being discharged pod#1 in stable condition.  F/U with [facility] in 4 weeks with Dr. Donnetta Hutching.  Consults:     Significant Diagnostic Studies: CBC Lab Results  Component Value Date   WBC 1.8* 04/09/2014   HGB 9.7* 04/09/2014   HCT 29.9* 04/09/2014   MCV 106.4* 04/09/2014   PLT 84* 04/09/2014    BMET    Component Value Date/Time   NA 137 04/09/2014 0450   NA 141 02/08/2014 1315   K 3.8 04/09/2014 0450   K 3.5 02/08/2014 1315   CL 103 04/09/2014 0450   CL 103 02/08/2014 1315   CO2 24 04/09/2014 0450   CO2 26 02/08/2014 1315   GLUCOSE 115* 04/09/2014 0450   GLUCOSE 105 02/08/2014 1315   BUN 9 04/09/2014 0450   BUN 11 02/08/2014 1315   CREATININE 0.76 04/09/2014 0450   CREATININE 0.9 02/08/2014 1315   CALCIUM 8.1* 04/09/2014  0450   CALCIUM 8.5 02/08/2014 1315   GFRNONAA 85* 04/09/2014 0450   GFRAA >90 04/09/2014 0450   COAG Lab Results  Component Value Date   INR 1.30 04/08/2014   INR 1.28 03/30/2014     Disposition:  Discharge to :Home Discharge Instructions    ABDOMINAL PROCEDURE/ANEURYSM REPAIR/AORTO-BIFEMORAL BYPASS:  Call MD for increased abdominal pain; cramping diarrhea; nausea/vomiting    Complete by:  As directed      Call MD for:  redness, tenderness, or signs of infection (pain, swelling, bleeding, redness, odor or green/yellow discharge around incision site)    Complete by:  As directed      Call MD for:  severe or increased pain, loss or decreased feeling  in affected limb(s)    Complete by:  As directed      Call MD for:  temperature >100.5    Complete by:  As directed      Discharge patient    Complete by:  As directed   Discharge pt to home     Driving Restrictions    Complete by:  As directed   No driving for 2 weeks     Lifting restrictions    Complete by:  As directed   No lifting for 4 weeks     Resume previous diet    Complete by:  As directed      may wash over wound with mild soap and water    Complete by:  As directed             Medication List    TAKE these medications        ciprofloxacin 500 MG tablet  Commonly known as:  CIPRO  Take 1 tablet (500 mg total) by mouth 2 (two) times daily. For 7 days.     doxazosin 4 MG tablet  Commonly known as:  CARDURA  Take 4 mg by mouth at bedtime.     finasteride 5 MG tablet  Commonly known as:  PROSCAR  Take 5 mg by mouth daily.     LAXATIVE PO  Take 1 tablet by mouth as needed (constipation).     lovastatin 20 MG tablet  Commonly known as:  MEVACOR  Take 20 mg by mouth at bedtime.     oxyCODONE-acetaminophen 5-325 MG per tablet  Commonly known as:  ROXICET  Take 1 tablet by mouth every 6 (six) hours as needed for severe pain.     REFRESH OP  Place 1 drop into both eyes daily as needed (dry eyes).      SYNTHROID 137 MCG tablet  Generic drug:  levothyroxine  Take 137 mcg by mouth daily before breakfast.       Verbal and written Discharge instructions given to the patient. Wound care per Discharge AVS     Follow-up Information    Follow up with EARLY, TODD, MD In 4 weeks.   Specialty:  Vascular Surgery   Why:  Office will call you to arrange an appointment (sent)   Contact information:   [place] [address] [phone]       Signed: Laurence Slate [facility] 04/14/2014, 9:23 AM  - For VQI Registry use --- Instructions: Press F2 to tab through selections.  Delete question if not applicable.   Post-op:  Time to Extubation: [x ] In OR, [ ]  < 12 hrs, [ ]  12-24 hrs, [ ]  >=24 hrs Vasopressors Req. Post-op: Yes MI: [ x] No, [ ]  Troponin only, [ ]  EKG or Clinical New Arrhythmia: No CHF: No ICU Stay: 1 days Transfusion: No  If yes, 0 units given  Complications: Resp failure: [ ]  none, [ ]  Pneumonia, [ ]  Ventilator Chg in renal function: [ ]  none, [ ]  Inc. Cr > 0.5, [ ]  Temp. Dialysis, [ ]  Permanent dialysis Leg ischemia: [ ]  No, [ ]  Yes, no Surgery needed, [ ]  Yes, Surgery needed, [ ]  Amputation Bowel ischemia: [ ]  No, [ ]  Medical Rx, [ ]  Surgical Rx Wound complication: [ ]  No, [ ]  Superficial separation/infection, [ ]  Return to OR Return to OR: No  Return to OR for bleeding: No Stroke: [ x] None, [ ]  Minor, [ ]  Major  Discharge medications: Statin use:  Yes ASA use:  No  for medical reason   Plavix use:  No  for medical reason   Beta blocker use:  No  for medical reason

## 2014-04-26 ENCOUNTER — Other Ambulatory Visit (HOSPITAL_BASED_OUTPATIENT_CLINIC_OR_DEPARTMENT_OTHER): Payer: Medicare Other | Admitting: Lab

## 2014-04-26 ENCOUNTER — Ambulatory Visit (HOSPITAL_BASED_OUTPATIENT_CLINIC_OR_DEPARTMENT_OTHER): Payer: Medicare Other

## 2014-04-26 ENCOUNTER — Ambulatory Visit (HOSPITAL_BASED_OUTPATIENT_CLINIC_OR_DEPARTMENT_OTHER): Payer: Medicare Other | Admitting: Hematology & Oncology

## 2014-04-26 ENCOUNTER — Encounter: Payer: Self-pay | Admitting: Hematology & Oncology

## 2014-04-26 DIAGNOSIS — D4620 Refractory anemia with excess of blasts, unspecified: Secondary | ICD-10-CM

## 2014-04-26 DIAGNOSIS — I714 Abdominal aortic aneurysm, without rupture, unspecified: Secondary | ICD-10-CM

## 2014-04-26 DIAGNOSIS — D46Z Other myelodysplastic syndromes: Secondary | ICD-10-CM

## 2014-04-26 LAB — MANUAL DIFFERENTIAL (CHCC SATELLITE)
ALC: 1 10*3/uL (ref 0.9–3.3)
ANC (CHCC HP manual diff): 0.5 10*3/uL — ABNORMAL LOW (ref 1.5–6.5)
BAND NEUTROPHILS: 1 % (ref 0–10)
LYMPH: 58 % — AB (ref 14–48)
MONO: 12 % (ref 0–13)
PLT EST ~~LOC~~: DECREASED
SEG: 28 % — ABNORMAL LOW (ref 40–75)
Variant Lymph: 1 % — ABNORMAL HIGH (ref 0–0)
nRBC: 1 % — ABNORMAL HIGH (ref 0–0)

## 2014-04-26 LAB — CBC WITH DIFFERENTIAL (CANCER CENTER ONLY)
HEMATOCRIT: 35.4 % — AB (ref 38.7–49.9)
HEMOGLOBIN: 11.4 g/dL — AB (ref 13.0–17.1)
MCH: 34.7 pg — AB (ref 28.0–33.4)
MCHC: 32.2 g/dL (ref 32.0–35.9)
MCV: 108 fL — AB (ref 82–98)
Platelets: 84 10*3/uL — ABNORMAL LOW (ref 145–400)
RBC: 3.29 10*6/uL — ABNORMAL LOW (ref 4.20–5.70)
RDW: 20.4 % — ABNORMAL HIGH (ref 11.1–15.7)
WBC: 1.7 10*3/uL — ABNORMAL LOW (ref 4.0–10.0)

## 2014-04-26 LAB — IRON AND TIBC CHCC
%SAT: 97 % — ABNORMAL HIGH (ref 20–55)
Iron: 156 ug/dL (ref 42–163)
TIBC: 161 ug/dL — AB (ref 202–409)
UIBC: 5 ug/dL — ABNORMAL LOW (ref 117–376)

## 2014-04-26 LAB — COMPREHENSIVE METABOLIC PANEL
ALK PHOS: 66 U/L (ref 39–117)
ALT: 34 U/L (ref 0–53)
AST: 35 U/L (ref 0–37)
Albumin: 3.8 g/dL (ref 3.5–5.2)
BILIRUBIN TOTAL: 0.9 mg/dL (ref 0.2–1.2)
BUN: 11 mg/dL (ref 6–23)
CO2: 27 mEq/L (ref 19–32)
Calcium: 8.8 mg/dL (ref 8.4–10.5)
Chloride: 104 mEq/L (ref 96–112)
Creatinine, Ser: 0.75 mg/dL (ref 0.50–1.35)
GLUCOSE: 96 mg/dL (ref 70–99)
Potassium: 4.1 mEq/L (ref 3.5–5.3)
Sodium: 138 mEq/L (ref 135–145)
Total Protein: 7.8 g/dL (ref 6.0–8.3)

## 2014-04-26 LAB — RETICULOCYTES (CHCC)
ABS Retic: 125.4 10*3/uL (ref 19.0–186.0)
RBC.: 3.39 MIL/uL — ABNORMAL LOW (ref 4.22–5.81)
Retic Ct Pct: 3.7 % — ABNORMAL HIGH (ref 0.4–2.3)

## 2014-04-26 LAB — FERRITIN CHCC: Ferritin: 2510 ng/ml — ABNORMAL HIGH (ref 22–316)

## 2014-04-26 NOTE — Progress Notes (Signed)
Andrew [name] presents today for phlebotomy per MD orders. Phlebotomy procedure started at 1003 and ended at 1008. 550mL removed. Patient observed for 30 minutes after procedure without any incident. Patient tolerated procedure well. IV needle removed intact.

## 2014-04-26 NOTE — Progress Notes (Signed)
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 He had his AAA repair earlier this month and did well. He is feeling much better.  His last phlebotomy was 03/23/14. His ferritin in September was 2,204. His Hgb today is 11.4. We will go ahead and phlebotomize him today. He denies fever, chills, n/v, cough, rash, headache, dizziness, SOB, chest pain, palpitations, abdominal pain, diarrhea, blood in urine or stool. His last bowel movement was yesterday but he has had some constipation that is not relieved with Miralax or stool softeners. He is going to try mag citrate the next time this happens.   No swelling, tenderness, numbness or tingling in his extremities. His appetite is ok and he is drinking plenty of fluids. No bleeding or pain. His appetite is good and he is drinking lots of water.   CURRENT TREATMENT: Phlebotomy to get the ferritin down to less than 100  REVIEW OF SYSTEMS: All other 10 point review of systems is negative.   PAST MEDICAL HISTORY: Past Medical History  Diagnosis Date  . Leg pain   . AAA (abdominal aortic aneurysm)   . Foot pain     while lying flat  . Thyroid disease   . Hereditary hemochromatosis 12/24/2013  . MDS (myelodysplastic syndrome), low grade 12/24/2013  . Anemia   . Hypothyroidism   . Depression   . Chronic kidney disease     has to self catherize    PAST SURGICAL HISTORY: Past Surgical History  Procedure Laterality Date  . Spine surgery  04/16/2006  . Eye surgery  July 2013    Cataract Right eye  . Eye surgery  Aug. 2013    Cataract Left eye  . Hernia repair    . Abdominal aortic endovascular stent graft  N/A 04/08/2014    Procedure: ABDOMINAL AORTIC ENDOVASCULAR STENT GRAFT;  Surgeon: Rosetta Posner, MD;  Location: [facility] OR;  Service: Vascular;  Laterality: N/A;    FAMILY HISTORY Family History  Problem Relation Age of Onset  . Cancer Mother   . Diabetes Mother   . Cancer Brother     GYNECOLOGIC HISTORY:  No LMP for male patient.   SOCIAL HISTORY: History   Social History  . Marital Status: Married    Spouse Name: N/A    Number of Children: N/A  . Years of Education: N/A   Occupational History  . Not on file.   Social History Main Topics  . Smoking status: Former Smoker -- 2.00 packs/day for 25 years    Types: Cigarettes    Start date: 07/01/1960    Quit date: 06/03/1985  . Smokeless tobacco: Never Used     Comment: quit smoking 26 years ago  . Alcohol Use: No  . Drug Use: No  . Sexual Activity: Not on file   Other Topics Concern  . Not on file   Social History Narrative    ADVANCED DIRECTIVES:  <no information>  HEALTH MAINTENANCE: History  Substance Use Topics  . Smoking status: Former Smoker -- 2.00 packs/day for 25 years    Types: Cigarettes    Start date: 07/01/1960    Quit date: 06/03/1985  . Smokeless tobacco: Never Used     Comment: quit smoking 26 years  ago  . Alcohol Use: No   Colonoscopy: PAP: Bone density: Lipid panel:  No Known Allergies  Current Outpatient Prescriptions  Medication Sig Dispense Refill  . Bisacodyl (LAXATIVE PO) Take 1 tablet by mouth as needed (constipation).     Marland Kitchen doxazosin (CARDURA) 4 MG tablet Take 4 mg by mouth at bedtime.      . finasteride (PROSCAR) 5 MG tablet Take 5 mg by mouth daily.      Marland Kitchen levothyroxine (SYNTHROID) 137 MCG tablet Take 137 mcg by mouth daily before breakfast.    . lovastatin (MEVACOR) 20 MG tablet Take 20 mg by mouth at bedtime.     . Polyvinyl Alcohol-Povidone (REFRESH OP) Place 1 drop into both eyes daily as needed (dry eyes).     No current facility-administered medications for this visit.     OBJECTIVE: Filed Vitals:   04/26/14 0850  BP: 98/62  Pulse: 71  Temp: 97.7 F (36.5 C)  Resp: 18    Filed Weights   04/26/14 0850  Weight: 192 lb (87.091 kg)   ECOG FS:0 - Asymptomatic Ocular: Sclerae unicteric, pupils equal, round and reactive to light Ear-nose-throat: Oropharynx clear, dentition fair Lymphatic: No cervical or supraclavicular adenopathy Lungs no rales or rhonchi, good excursion bilaterally Heart regular rate and rhythm, no murmur appreciated Abd soft, nontender, positive bowel sounds MSK no focal spinal tenderness, no joint edema Neuro: non-focal, well-oriented, appropriate affect  LAB RESULTS: CMP     Component Value Date/Time   NA 137 04/09/2014 0450   NA 141 02/08/2014 1315   K 3.8 04/09/2014 0450   K 3.5 02/08/2014 1315   CL 103 04/09/2014 0450   CL 103 02/08/2014 1315   CO2 24 04/09/2014 0450   CO2 26 02/08/2014 1315   GLUCOSE 115* 04/09/2014 0450   GLUCOSE 105 02/08/2014 1315   BUN 9 04/09/2014 0450   BUN 11 02/08/2014 1315   CREATININE 0.76 04/09/2014 0450   CREATININE 0.9 02/08/2014 1315   CALCIUM 8.1* 04/09/2014 0450   CALCIUM 8.5 02/08/2014 1315   PROT 8.0 03/30/2014 1136   PROT 7.4 02/08/2014 1315   ALBUMIN 3.3* 03/30/2014 1136   AST 25 03/30/2014 1136   AST 31 02/08/2014 1315   ALT 22 03/30/2014 1136   ALT 25 02/08/2014 1315   ALKPHOS 66 03/30/2014 1136   ALKPHOS 59 02/08/2014 1315   BILITOT 0.9 03/30/2014 1136   BILITOT 1.10 02/08/2014 1315   GFRNONAA 85* 04/09/2014 0450   GFRAA >90 04/09/2014 0450   INo results found for: SPEP, UPEP Lab Results  Component Value Date   WBC 1.7* 04/26/2014   HGB 11.4* 04/26/2014   HCT 35.4* 04/26/2014   MCV 108* 04/26/2014   PLT 84* 04/26/2014   No results found for: LABCA2 No components found for: DUKGU542  STUDIES: None  ASSESSMENT/PLAN: Mr. Strebeck is 80 year old come in. He has both myelodysplasia and hemochromatosis. His last phlebotomy was on 03/23/14. We gave him 2 units  of blood before his AAA repair. He did well with surgery and is feeling much better.  His Hgb today is up to 11.4. We will go ahead and phlebotomize him.  We will see him back in 3 weeks for labs, follow-up and possibly phlebotomy.   He knows to call here with any questions or concerns and to go to the ED in the event of an emergency. We can certainly see him sooner if need be.   Eliezer Bottom, NP 04/26/2014 9:48 AM

## 2014-04-26 NOTE — Patient Instructions (Signed)

## 2014-05-04 ENCOUNTER — Other Ambulatory Visit: Payer: Medicare Other | Admitting: Lab

## 2014-05-09 ENCOUNTER — Encounter: Payer: Self-pay | Admitting: Vascular Surgery

## 2014-05-10 ENCOUNTER — Ambulatory Visit
Admission: RE | Admit: 2014-05-10 | Discharge: 2014-05-10 | Disposition: A | Discharge status: Home / Self Care | Payer: Medicare Other | Source: Ambulatory Visit | Attending: Vascular Surgery | Admitting: Vascular Surgery

## 2014-05-10 ENCOUNTER — Encounter: Payer: Self-pay | Admitting: Vascular Surgery

## 2014-05-10 ENCOUNTER — Ambulatory Visit (INDEPENDENT_AMBULATORY_CARE_PROVIDER_SITE_OTHER): Payer: Medicare Other | Admitting: Vascular Surgery

## 2014-05-10 VITALS — BP 125/72 | HR 95 | Resp 18 | Ht 70.0 in | Wt 192.0 lb

## 2014-05-10 DIAGNOSIS — Z48812 Encounter for surgical aftercare following surgery on the circulatory system: Secondary | ICD-10-CM

## 2014-05-10 DIAGNOSIS — I714 Abdominal aortic aneurysm, without rupture, unspecified: Secondary | ICD-10-CM

## 2014-05-10 DIAGNOSIS — Z95828 Presence of other vascular implants and grafts: Secondary | ICD-10-CM

## 2014-05-10 MED ORDER — IOHEXOL 350 MG/ML SOLN
80.0000 mL | Freq: Once | INTRAVENOUS | Status: AC | PRN
  Administered 2014-05-10: 80 mL via INTRAVENOUS

## 2014-05-10 NOTE — Addendum Note (Signed)
Addended by: Mena Goes on: 05/10/2014 04:53 PM   Modules accepted: Orders

## 2014-05-10 NOTE — Progress Notes (Signed)
The patient resents today for follow-up of stent graft repair of a large infrarenal abdominal aortic aneurysm on 04/08/2014. He did well and was discharged home on postoperative day #1. He has continued to do well following his surgery with no difficulty in his recovery. He does have severe chronic back discomfort and this is his main limiting factor. This is no worse following surgery.  On physical exam his groin puncture sites are well-healed with no evidence of false aneurysm he does have 2+ femoral pulses bilaterally. Dominant exam is benign with no masses noted  CT scan today was reviewed with the patient. This shows excellent positioning of his stent graft with no evidence of endoleak. Maximal size of 6.1 cm  Stable one month out from surgery. We'll continue his usual activities without limitation. We will see him in 6 months with repeat CT scan

## 2014-05-17 ENCOUNTER — Encounter: Payer: Self-pay | Admitting: Hematology & Oncology

## 2014-05-17 ENCOUNTER — Other Ambulatory Visit (HOSPITAL_BASED_OUTPATIENT_CLINIC_OR_DEPARTMENT_OTHER): Payer: Medicare Other | Admitting: Lab

## 2014-05-17 ENCOUNTER — Ambulatory Visit (HOSPITAL_BASED_OUTPATIENT_CLINIC_OR_DEPARTMENT_OTHER): Payer: Medicare Other | Admitting: Hematology & Oncology

## 2014-05-17 ENCOUNTER — Ambulatory Visit (HOSPITAL_BASED_OUTPATIENT_CLINIC_OR_DEPARTMENT_OTHER): Payer: Medicare Other

## 2014-05-17 VITALS — BP 117/60 | HR 71 | Temp 97.8°F | Resp 18 | Ht 70.0 in | Wt 193.0 lb

## 2014-05-17 DIAGNOSIS — D4620 Refractory anemia with excess of blasts, unspecified: Secondary | ICD-10-CM

## 2014-05-17 DIAGNOSIS — D46Z Other myelodysplastic syndromes: Secondary | ICD-10-CM

## 2014-05-17 LAB — IRON AND TIBC CHCC
Iron: 161 ug/dL (ref 42–163)
TIBC: 159 ug/dL — AB (ref 202–409)
UIBC: <1 ug/dL (ref 117–376)

## 2014-05-17 LAB — MANUAL DIFFERENTIAL (CHCC SATELLITE)
ALC: 0.9 10*3/uL (ref 0.9–3.3)
ANC (CHCC MAN DIFF): 0.4 10*3/uL — AB (ref 1.5–6.5)
EOS: 1 % (ref 0–7)
LYMPH: 59 % — AB (ref 14–48)
MONO: 14 % — AB (ref 0–13)
PLT EST ~~LOC~~: DECREASED
SEG: 26 % — ABNORMAL LOW (ref 40–75)

## 2014-05-17 LAB — CBC WITH DIFFERENTIAL (CANCER CENTER ONLY)
HEMATOCRIT: 34.3 % — AB (ref 38.7–49.9)
HEMOGLOBIN: 11.4 g/dL — AB (ref 13.0–17.1)
MCH: 36 pg — ABNORMAL HIGH (ref 28.0–33.4)
MCHC: 33.2 g/dL (ref 32.0–35.9)
MCV: 108 fL — AB (ref 82–98)
Platelets: 80 10*3/uL — ABNORMAL LOW (ref 145–400)
RBC: 3.17 10*6/uL — ABNORMAL LOW (ref 4.20–5.70)
RDW: 20.7 % — ABNORMAL HIGH (ref 11.1–15.7)
WBC: 1.5 10*3/uL — AB (ref 4.0–10.0)

## 2014-05-17 LAB — RETICULOCYTES (CHCC)
ABS Retic: 94.5 10*3/uL (ref 19.0–186.0)
RBC.: 3.26 MIL/uL — AB (ref 4.22–5.81)
RETIC CT PCT: 2.9 % — AB (ref 0.4–2.3)

## 2014-05-17 LAB — CHCC SATELLITE - SMEAR

## 2014-05-17 LAB — FERRITIN CHCC: Ferritin: 2214 ng/ml — ABNORMAL HIGH (ref 22–316)

## 2014-05-17 NOTE — Progress Notes (Signed)
Hematology and Oncology Follow Up Visit  Andrew [name] [id number] 03/12/1933 80 y.o. 05/17/2014   Principle Diagnosis:   Hemachromatosis (homozygous for C282Y mutation)  Refractory anemia with multilineage dysplasia)  Current Therapy:   Phlebotomy to get the ferritin down to less than 100 Aranesp 300 g subcutaneous for hemoglobin less than 11    Interim History:  [name] is back for followup. He actually looks pretty good. He did have Andrew abdominal aortic aneurysm surgery. This is about 5 weeks ago. He saw Dr. Donnetta Hutching yesterday. Everything looks pretty good.  He got to Andrew surgery without ally difficulties. There is no bleeding. There is no problems with infection.  Andrew last iron studies back in late November showed a ferritin of 2500 with an iron saturation of 97%.  He's been taking some vitamins that do have vitamin C. I told him to stop taking this.  He was transfused before Andrew surgery. I did this helped getting him through surgery. However, he did increase Andrew iron load a little bit.  He's had no fever. He's had a good appetite. He's had no nausea or vomiting. Medications: Current outpatient prescriptions: Bisacodyl (LAXATIVE PO), Take 1 tablet by mouth as needed (constipation). , Disp: , Rfl: ;  doxazosin (CARDURA) 4 MG tablet, Take 4 mg by mouth at bedtime.  , Disp: , Rfl: ;  finasteride (PROSCAR) 5 MG tablet, Take 5 mg by mouth daily.  , Disp: , Rfl: ;  levothyroxine (SYNTHROID) 137 MCG tablet, Take 137 mcg by mouth daily before breakfast., Disp: , Rfl:  lovastatin (MEVACOR) 20 MG tablet, Take 20 mg by mouth at bedtime. , Disp: , Rfl: ;  Polyvinyl Alcohol-Povidone (REFRESH OP), Place 1 drop into both eyes daily as needed (dry eyes)., Disp: , Rfl:   Allergies: No Known Allergies  Past Medical History, Surgical history, Social history, and Family History were reviewed and updated.  Review of Systems: As above  Physical Exam:  height is 5\' 10"  (1.778 m) and weight  is 193 lb (87.544 kg). Andrew oral temperature is 97.8 F (36.6 C). Andrew blood pressure is 117/60 and Andrew pulse is 71. Andrew respiration is 18.   [name]. Head and neck exam shows no ocular or oral lesions. There are no palpable cervical or supraclavicular lymph nodes. Lungs are clear. Cardiac exam regular rate and rhythm with no murmurs, rubs or bruits.. Abdomen is soft. Has good bowel sounds. There is no fluid wave. There is a well-healed laparotomy scar from Andrew [name]. There is no palpable liver or spleen tip. Extremities shows no clubbing, cyanosis or edema. Skin exam no rashes. He has some scattered ecchymoses. Neurological exam shows some slight decrease in sensation in Andrew legs.  Lab Results  Component Value Date   WBC 1.5* 05/17/2014   HGB 11.4* 05/17/2014   HCT 34.3* 05/17/2014   MCV 108* 05/17/2014   PLT 80* 05/17/2014     Chemistry      Component Value Date/Time   NA 138 04/26/2014 0848   NA 141 02/08/2014 1315   K 4.1 04/26/2014 0848   K 3.5 02/08/2014 1315   CL 104 04/26/2014 0848   CL 103 02/08/2014 1315   CO2 27 04/26/2014 0848   CO2 26 02/08/2014 1315   BUN 11 04/26/2014 0848   BUN 11 02/08/2014 1315   CREATININE 0.75 04/26/2014 0848   CREATININE 0.9 02/08/2014 1315      Component Value Date/Time   CALCIUM 8.8 04/26/2014 0848  CALCIUM 8.5 02/08/2014 1315   ALKPHOS 66 04/26/2014 0848   ALKPHOS 59 02/08/2014 1315   AST 35 04/26/2014 0848   AST 31 02/08/2014 1315   ALT 34 04/26/2014 0848   ALT 25 02/08/2014 1315   BILITOT 0.9 04/26/2014 0848   BILITOT 1.10 02/08/2014 1315         Impression and Plan: Andrew [name] is 79 year old come in. He has both myelodysplasia and hemochromatosis. He has an incredible amount of iron. This is probably due to hemachromatosis in addition to ineffective erythropoiesis.  I will continue to phlebotomize him.  I Aranesp certainly has helped. We do not have to give him any today. We will continue to  phlebotomize him.  I want to see him back in one more month.  I think that he always will have some degree of iron overload. Given Andrew age, I really don't think we have to get too aggressive with trying to get Andrew iron levels down. Because of the underlying myelodysplasia, he has a certain degree of ineffective erythropoiesis.   Volanda Napoleon, MD 12/15/201511:26 AM

## 2014-05-17 NOTE — Patient Instructions (Signed)

## 2014-05-17 NOTE — Progress Notes (Signed)
Andrew [name] presents today for phlebotomy per MD orders. Phlebotomy procedure started at 1126 and ended at 1135. Approximately removed. Patient observed for 30 minutes after procedure without any incident. Patient tolerated procedure well. IV needle removed intact.

## 2014-05-18 ENCOUNTER — Other Ambulatory Visit: Payer: Medicare Other | Admitting: Lab

## 2014-06-14 ENCOUNTER — Other Ambulatory Visit (HOSPITAL_BASED_OUTPATIENT_CLINIC_OR_DEPARTMENT_OTHER): Payer: Medicare Other | Admitting: Lab

## 2014-06-14 ENCOUNTER — Ambulatory Visit (HOSPITAL_BASED_OUTPATIENT_CLINIC_OR_DEPARTMENT_OTHER): Payer: Medicare Other | Admitting: Family

## 2014-06-14 ENCOUNTER — Encounter: Payer: Self-pay | Admitting: Family

## 2014-06-14 VITALS — BP 112/59 | HR 70 | Temp 97.9°F | Resp 20 | Ht 70.0 in | Wt 191.0 lb

## 2014-06-14 DIAGNOSIS — G629 Polyneuropathy, unspecified: Secondary | ICD-10-CM

## 2014-06-14 DIAGNOSIS — D4620 Refractory anemia with excess of blasts, unspecified: Secondary | ICD-10-CM

## 2014-06-14 DIAGNOSIS — D469 Myelodysplastic syndrome, unspecified: Secondary | ICD-10-CM

## 2014-06-14 DIAGNOSIS — F418 Other specified anxiety disorders: Secondary | ICD-10-CM

## 2014-06-14 LAB — CMP (CANCER CENTER ONLY)
ALK PHOS: 56 U/L (ref 26–84)
ALT: 14 U/L (ref 10–47)
AST: 25 U/L (ref 11–38)
Albumin: 3.2 g/dL — ABNORMAL LOW (ref 3.3–5.5)
BILIRUBIN TOTAL: 0.9 mg/dL (ref 0.20–1.60)
BUN, Bld: 13 mg/dL (ref 7–22)
CALCIUM: 8.8 mg/dL (ref 8.0–10.3)
CO2: 29 mEq/L (ref 18–33)
CREATININE: 0.9 mg/dL (ref 0.6–1.2)
Chloride: 104 mEq/L (ref 98–108)
Glucose, Bld: 95 mg/dL (ref 73–118)
Potassium: 3.7 mEq/L (ref 3.3–4.7)
SODIUM: 142 meq/L (ref 128–145)
TOTAL PROTEIN: 7.9 g/dL (ref 6.4–8.1)

## 2014-06-14 LAB — MANUAL DIFFERENTIAL (CHCC SATELLITE)
ALC: 0.7 10*3/uL — AB (ref 0.9–3.3)
ANC (CHCC MAN DIFF): 1.4 10*3/uL — AB (ref 1.5–6.5)
BAND NEUTROPHILS: 2 % (ref 0–10)
Eos: 2 % (ref 0–7)
LYMPH: 28 % (ref 14–48)
MONO: 12 % (ref 0–13)
NRBC: 1 % — AB (ref 0–0)
PLT EST ~~LOC~~: DECREASED
Platelet Morphology: NORMAL
SEG: 56 % (ref 40–75)

## 2014-06-14 LAB — RETICULOCYTES (CHCC)
ABS Retic: 135.9 10*3/uL (ref 19.0–186.0)
RBC.: 3.16 MIL/uL — AB (ref 4.22–5.81)
Retic Ct Pct: 4.3 % — ABNORMAL HIGH (ref 0.4–2.3)

## 2014-06-14 LAB — CBC WITH DIFFERENTIAL (CANCER CENTER ONLY)
HCT: 34.7 % — ABNORMAL LOW (ref 38.7–49.9)
HGB: 11.3 g/dL — ABNORMAL LOW (ref 13.0–17.1)
MCH: 36.2 pg — AB (ref 28.0–33.4)
MCHC: 32.6 g/dL (ref 32.0–35.9)
MCV: 111 fL — AB (ref 82–98)
Platelets: 103 10*3/uL — ABNORMAL LOW (ref 145–400)
RBC: 3.12 10*6/uL — ABNORMAL LOW (ref 4.20–5.70)
RDW: 19.8 % — AB (ref 11.1–15.7)
WBC: 2.4 10*3/uL — ABNORMAL LOW (ref 4.0–10.0)

## 2014-06-14 LAB — IRON AND TIBC CHCC
%SAT: 97 % — ABNORMAL HIGH (ref 20–55)
Iron: 137 ug/dL (ref 42–163)
TIBC: 141 ug/dL — ABNORMAL LOW (ref 202–409)
UIBC: 4 ug/dL — AB (ref 117–376)

## 2014-06-14 LAB — FERRITIN CHCC: Ferritin: 1658 ng/ml — ABNORMAL HIGH (ref 22–316)

## 2014-06-14 MED ORDER — ESCITALOPRAM OXALATE 10 MG PO TABS: 10.0000 mg | ORAL_TABLET | Freq: Every day | ORAL | Status: DC

## 2014-06-14 MED ORDER — LACTULOSE 10 GM/15ML PO SOLN: 10.0000 g | Freq: Three times a day (TID) | ORAL | Status: DC

## 2014-06-14 MED ORDER — GABAPENTIN 100 MG PO CAPS: 100.0000 mg | ORAL_CAPSULE | Freq: Three times a day (TID) | ORAL | Status: DC

## 2014-06-14 NOTE — Progress Notes (Signed)
[facility]  [phone] [phone]  ID: JAMILLE YOSHINO OB: December 27, 1932 MR#: [id number] OHY#:[id number] Patient Care Team: Leonard Downing, MD as PCP - General (Family Medicine)  DIAGNOSIS: Hemachromatosis (homozygous for C282Y mutation) Refractory anemia with multilineage dysplasia  INTERVAL HISTORY: Mr.Andrew [name] is here today for a follow-up. He is very upset and tearful today. He is having neuropathy pain in his lower legs and feet. He is also very depressed/anxious. His last phlebotomy was 05/17/14. At that time, his iron saturation was >100 and ferritin was 2,214.  He denies fever, chills, n/v, cough, rash, headache, dizziness, SOB, chest pain, palpitations, abdominal pain, diarrhea, blood in urine or stool. He has chronic constipation and this is another issue that is bothering him quite a bit.   No swelling in his extremities.  His appetite is ok and he is drinking plenty of fluids. His weight is stable at 191 lbs.   CURRENT TREATMENT: Phlebotomy to get the ferritin down to less than 100  REVIEW OF SYSTEMS: All other 10 point review of systems is negative.   PAST MEDICAL HISTORY: Past Medical History  Diagnosis Date  . Leg pain   . AAA (abdominal aortic aneurysm)   . Foot pain     while lying flat  . Thyroid disease   . Hereditary hemochromatosis 12/24/2013  . MDS (myelodysplastic syndrome), low grade 12/24/2013  . Anemia   . Hypothyroidism   . Depression   . Chronic kidney disease     has to self catherize    PAST SURGICAL HISTORY: Past Surgical History  Procedure Laterality Date  . Spine surgery  04/16/2006  . Eye surgery  July 2013    Cataract Right eye  . Eye surgery  Aug. 2013    Cataract Left eye  . Hernia repair    . Abdominal aortic endovascular stent graft N/A 04/08/2014    Procedure: ABDOMINAL AORTIC ENDOVASCULAR STENT GRAFT;  Surgeon: Rosetta Posner, MD;  Location: [facility] OR;  Service: Vascular;  Laterality: N/A;     FAMILY HISTORY Family History  Problem Relation Age of Onset  . Cancer Mother   . Diabetes Mother   . Cancer Brother     GYNECOLOGIC HISTORY:  No LMP for male patient.   SOCIAL HISTORY: History   Social History  . Marital Status: Married    Spouse Name: N/A    Number of Children: N/A  . Years of Education: N/A   Occupational History  . Not on file.   Social History Main Topics  . Smoking status: Former Smoker -- 2.00 packs/day for 25 years    Types: Cigarettes    Start date: 07/01/1960    Quit date: 06/03/1985  . Smokeless tobacco: Never Used     Comment: quit smoking 26 years ago  . Alcohol Use: No  . Drug Use: No  . Sexual Activity: Not on file   Other Topics Concern  . Not on file   Social History Narrative    ADVANCED DIRECTIVES:  <no information>  HEALTH MAINTENANCE: History  Substance Use Topics  . Smoking status: Former Smoker -- 2.00 packs/day for 25 years    Types: Cigarettes    Start date: 07/01/1960    Quit date: 06/03/1985  . Smokeless tobacco: Never Used     Comment: quit smoking 26 years ago  . Alcohol Use: No   Colonoscopy: PAP: Bone density: Lipid panel:  No Known Allergies  Current Outpatient Prescriptions  Medication Sig Dispense Refill  .  Bisacodyl (LAXATIVE PO) Take 1 tablet by mouth as needed (constipation).     Marland Kitchen doxazosin (CARDURA) 4 MG tablet Take 4 mg by mouth at bedtime.      . finasteride (PROSCAR) 5 MG tablet Take 5 mg by mouth daily.      Marland Kitchen levothyroxine (SYNTHROID) 137 MCG tablet Take 137 mcg by mouth daily before breakfast.    . lovastatin (MEVACOR) 20 MG tablet Take 20 mg by mouth at bedtime.     . Polyvinyl Alcohol-Povidone (REFRESH OP) Place 1 drop into both eyes daily as needed (dry eyes).    Marland Kitchen escitalopram (LEXAPRO) 10 MG tablet Take 1 tablet (10 mg total) by mouth daily. 30 tablet 6  . gabapentin (NEURONTIN) 100 MG capsule Take 1 capsule (100 mg total) by mouth 3 (three) times daily. 90 capsule 0  .  lactulose (CHRONULAC) 10 GM/15ML solution Take 15 mLs (10 g total) by mouth 3 (three) times daily. As needed. 480 mL 3   No current facility-administered medications for this visit.    OBJECTIVE: Filed Vitals:   06/14/14 1112  BP: 112/59  Pulse: 70  Temp: 97.9 F (36.6 C)  Resp: 20    Filed Weights   06/14/14 1112  Weight: 191 lb (86.637 kg)   ECOG FS:1 - Symptomatic but completely ambulatory Ocular: Sclerae unicteric, pupils equal, round and reactive to light Ear-nose-throat: Oropharynx clear, dentition fair Lymphatic: No cervical or supraclavicular adenopathy Lungs no rales or rhonchi, good excursion bilaterally Heart regular rate and rhythm, no murmur appreciated Abd soft, nontender, positive bowel sounds MSK no focal spinal tenderness, no joint edema Neuro: non-focal, well-oriented, appropriate affect  LAB RESULTS: CMP     Component Value Date/Time   NA 142 06/14/2014 1028   NA 138 04/26/2014 0848   K 3.7 06/14/2014 1028   K 4.1 04/26/2014 0848   CL 104 06/14/2014 1028   CL 104 04/26/2014 0848   CO2 29 06/14/2014 1028   CO2 27 04/26/2014 0848   GLUCOSE 95 06/14/2014 1028   GLUCOSE 96 04/26/2014 0848   BUN 13 06/14/2014 1028   BUN 11 04/26/2014 0848   CREATININE 0.9 06/14/2014 1028   CREATININE 0.75 04/26/2014 0848   CALCIUM 8.8 06/14/2014 1028   CALCIUM 8.8 04/26/2014 0848   PROT 7.9 06/14/2014 1028   PROT 7.8 04/26/2014 0848   ALBUMIN 3.8 04/26/2014 0848   AST 25 06/14/2014 1028   AST 35 04/26/2014 0848   ALT 14 06/14/2014 1028   ALT 34 04/26/2014 0848   ALKPHOS 56 06/14/2014 1028   ALKPHOS 66 04/26/2014 0848   BILITOT 0.90 06/14/2014 1028   BILITOT 0.9 04/26/2014 0848   GFRNONAA 85* 04/09/2014 0450   GFRAA >90 04/09/2014 0450   INo results found for: SPEP, UPEP Lab Results  Component Value Date   WBC 2.4* 06/14/2014   HGB 11.3* 06/14/2014   HCT 34.7* 06/14/2014   MCV 111* 06/14/2014   PLT 103* 06/14/2014   No results found for: LABCA2 No  components found for: HUDJS970  STUDIES: None  ASSESSMENT/PLAN: Mr. Wacker is 81 year old come in. He has both myelodysplasia and hemochromatosis. He is very tearful and depressed. He is having a lot of neuropathy pain in his legs and feet.  We will start him on Neurontin 100 mg TID and re consult neurology for neuropathy.  We will also try him on Lexapro 10 mg daily for his depression and anxiety.  We will have him take lactulose 2-3 times daily as needed for  constipation.  His last phlebotomy was on 05/17/14. We will see what his ferritin level is and get him back in for a phlebotomy this week.  We will see him back in 6 weeks for labs, follow-up and possibly phlebotomy.   He knows to call here with any questions or concerns and to go to the ED in the event of an emergency. We can certainly see him sooner if need be.   Eliezer Bottom, NP 06/14/2014 1:17 PM

## 2014-06-15 ENCOUNTER — Telehealth: Payer: Self-pay | Admitting: Hematology & Oncology

## 2014-06-15 NOTE — Telephone Encounter (Signed)
Pt aware of 1-14 appointment

## 2014-06-16 ENCOUNTER — Ambulatory Visit (HOSPITAL_BASED_OUTPATIENT_CLINIC_OR_DEPARTMENT_OTHER): Payer: Medicare Other

## 2014-06-16 DIAGNOSIS — D4620 Refractory anemia with excess of blasts, unspecified: Secondary | ICD-10-CM

## 2014-06-16 NOTE — Progress Notes (Signed)
Andrew [name] presents today for phlebotomy per MD orders. Phlebotomy procedure started at 1005 and ended at 1010. 573mL removed. Patient observed for 30 minutes after procedure without any incident. Patient tolerated procedure well. IV needle removed intact.  Pt's blood pressure was low after phlebotomy and he was encouraged to drink water. His blood pressure was stable upon discharge.

## 2014-06-16 NOTE — Patient Instructions (Signed)

## 2014-07-26 ENCOUNTER — Ambulatory Visit (HOSPITAL_BASED_OUTPATIENT_CLINIC_OR_DEPARTMENT_OTHER): Payer: Medicare Other | Admitting: Hematology & Oncology

## 2014-07-26 ENCOUNTER — Other Ambulatory Visit (HOSPITAL_BASED_OUTPATIENT_CLINIC_OR_DEPARTMENT_OTHER): Payer: Medicare Other | Admitting: Lab

## 2014-07-26 ENCOUNTER — Encounter: Payer: Self-pay | Admitting: Hematology & Oncology

## 2014-07-26 ENCOUNTER — Ambulatory Visit (HOSPITAL_BASED_OUTPATIENT_CLINIC_OR_DEPARTMENT_OTHER): Payer: Medicare Other

## 2014-07-26 VITALS — BP 99/54 | HR 69 | Temp 97.7°F | Resp 18 | Ht 70.0 in | Wt 190.0 lb

## 2014-07-26 DIAGNOSIS — D46Z Other myelodysplastic syndromes: Secondary | ICD-10-CM

## 2014-07-26 DIAGNOSIS — D4620 Refractory anemia with excess of blasts, unspecified: Secondary | ICD-10-CM

## 2014-07-26 LAB — MANUAL DIFFERENTIAL (CHCC SATELLITE)
ALC: 1.3 10*3/uL (ref 0.9–3.3)
ANC (CHCC MAN DIFF): 0.9 10*3/uL — AB (ref 1.5–6.5)
LYMPH: 55 % — AB (ref 14–48)
METAMYELOCYTES PCT: 1 % — AB (ref 0–0)
MONO: 8 % (ref 0–13)
MYELOCYTES: 2 % — AB (ref 0–0)
PLT EST ~~LOC~~: DECREASED
SEG: 34 % — ABNORMAL LOW (ref 40–75)

## 2014-07-26 LAB — CBC WITH DIFFERENTIAL (CANCER CENTER ONLY)
HEMATOCRIT: 34.5 % — AB (ref 38.7–49.9)
HEMOGLOBIN: 11.3 g/dL — AB (ref 13.0–17.1)
MCH: 36.9 pg — ABNORMAL HIGH (ref 28.0–33.4)
MCHC: 32.8 g/dL (ref 32.0–35.9)
MCV: 113 fL — AB (ref 82–98)
Platelets: 115 10*3/uL — ABNORMAL LOW (ref 145–400)
RBC: 3.06 10*6/uL — ABNORMAL LOW (ref 4.20–5.70)
RDW: 18.2 % — ABNORMAL HIGH (ref 11.1–15.7)
WBC: 2.3 10*3/uL — AB (ref 4.0–10.0)

## 2014-07-26 LAB — CMP (CANCER CENTER ONLY)
ALT(SGPT): 24 U/L (ref 10–47)
AST: 26 U/L (ref 11–38)
Albumin: 3.2 g/dL — ABNORMAL LOW (ref 3.3–5.5)
Alkaline Phosphatase: 76 U/L (ref 26–84)
BUN: 15 mg/dL (ref 7–22)
CALCIUM: 8.9 mg/dL (ref 8.0–10.3)
CHLORIDE: 103 meq/L (ref 98–108)
CO2: 26 mEq/L (ref 18–33)
CREATININE: 1.1 mg/dL (ref 0.6–1.2)
GLUCOSE: 131 mg/dL — AB (ref 73–118)
Potassium: 4.2 mEq/L (ref 3.3–4.7)
Sodium: 144 mEq/L (ref 128–145)
Total Bilirubin: 0.8 mg/dl (ref 0.20–1.60)
Total Protein: 8.1 g/dL (ref 6.4–8.1)

## 2014-07-26 LAB — FERRITIN CHCC: Ferritin: 1972 ng/ml — ABNORMAL HIGH (ref 22–316)

## 2014-07-26 LAB — IRON AND TIBC CHCC
%SAT: 97 % — AB (ref 20–55)
Iron: 149 ug/dL (ref 42–163)
TIBC: 154 ug/dL — ABNORMAL LOW (ref 202–409)
UIBC: 5 ug/dL — ABNORMAL LOW (ref 117–376)

## 2014-07-26 LAB — RETICULOCYTES (CHCC)
ABS Retic: 122.5 10*3/uL (ref 19.0–186.0)
RBC.: 3.14 MIL/uL — ABNORMAL LOW (ref 4.22–5.81)
RETIC CT PCT: 3.9 % — AB (ref 0.4–2.3)

## 2014-07-26 NOTE — Progress Notes (Signed)
Andrew [name] presents today for phlebotomy per MD orders. Phlebotomy procedure started at 1200and ended at 1207. Approximately 500 mls removed. Patient observed for 30 minutes after procedure without any incident. Patient tolerated procedure well. IV needle removed intact.

## 2014-07-26 NOTE — Progress Notes (Signed)
Hematology and Oncology Follow Up Visit  PASQUAL FARIAS [id number] April 07, 1933 81 y.o. 07/26/2014   Principle Diagnosis:   Hemachromatosis (homozygous for C282Y mutation)  Refractory anemia with multilineage dysplasia)  Current Therapy:   Phlebotomy to get the ferritin down to less than 100 Aranesp 300 g subcutaneous for hemoglobin less than 11    Interim History:  Mr.  Chew is back for followup. He actually looks pretty good. He's having some problems with neuropathy. He tried taking gabapentin but cannot tolerate this.  He is on Mevacor. I wonder this may not be causing some of the issues.  His last iron studies done back in early January showed a ferritin of 1658. This actually is much better for him.  He had his 82th birthday a few weeks ago. He enjoyed this.   He's had no fever. He's had a good appetite. He's had no nausea or vomiting. Medications:  Current outpatient prescriptions:  .  doxazosin (CARDURA) 4 MG tablet, Take 4 mg by mouth at bedtime.  , Disp: , Rfl:  .  escitalopram (LEXAPRO) 10 MG tablet, Take 1 tablet (10 mg total) by mouth daily., Disp: 30 tablet, Rfl: 6 .  finasteride (PROSCAR) 5 MG tablet, Take 5 mg by mouth daily.  , Disp: , Rfl:  .  lactulose (CHRONULAC) 10 GM/15ML solution, Take 15 mLs (10 g total) by mouth 3 (three) times daily. As needed., Disp: 480 mL, Rfl: 3 .  levothyroxine (SYNTHROID) 137 MCG tablet, Take 137 mcg by mouth daily before breakfast., Disp: , Rfl:  .  lovastatin (MEVACOR) 20 MG tablet, Take 20 mg by mouth at bedtime. , Disp: , Rfl:  .  Polyvinyl Alcohol-Povidone (REFRESH OP), Place 1 drop into both eyes daily as needed (dry eyes)., Disp: , Rfl:  .  gabapentin (NEURONTIN) 100 MG capsule, Take 1 capsule (100 mg total) by mouth 3 (three) times daily. (Patient not taking: Reported on 07/26/2014), Disp: 90 capsule, Rfl: 0  Allergies: No Known Allergies  Past Medical History, Surgical history, Social history, and Family History were  reviewed and updated.  Review of Systems: As above  Physical Exam:  height is 5\' 10"  (1.778 m) and weight is 190 lb (86.183 kg). His oral temperature is 97.7 F (36.5 C). His blood pressure is 99/54 and his pulse is 69. His respiration is 18.   Elderly white woman. Head and neck exam shows no ocular or oral lesions. There are no palpable cervical or supraclavicular lymph nodes. Lungs are clear. Cardiac exam regular rate and rhythm with no murmurs, rubs or bruits.. Abdomen is soft. He has good bowel sounds. There is no fluid wave. There is a well-healed laparotomy scar from his aneurysm repair. There is no palpable liver or spleen tip. Extremities shows no clubbing, cyanosis or edema. Skin exam no rashes. He has some scattered ecchymoses. Neurological exam shows some slight decrease in sensation in his legs.  Lab Results  Component Value Date   WBC 2.3* 07/26/2014   HGB 11.3* 07/26/2014   HCT 34.5* 07/26/2014   MCV 113* 07/26/2014   PLT 115* 07/26/2014     Chemistry      Component Value Date/Time   NA 144 07/26/2014 1036   NA 138 04/26/2014 0848   K 4.2 07/26/2014 1036   K 4.1 04/26/2014 0848   CL 103 07/26/2014 1036   CL 104 04/26/2014 0848   CO2 26 07/26/2014 1036   CO2 27 04/26/2014 0848   BUN 15 07/26/2014 1036  BUN 11 04/26/2014 0848   CREATININE 1.1 07/26/2014 1036   CREATININE 0.75 04/26/2014 0848      Component Value Date/Time   CALCIUM 8.9 07/26/2014 1036   CALCIUM 8.8 04/26/2014 0848   ALKPHOS 76 07/26/2014 1036   ALKPHOS 66 04/26/2014 0848   AST 26 07/26/2014 1036   AST 35 04/26/2014 0848   ALT 24 07/26/2014 1036   ALT 34 04/26/2014 0848   BILITOT 0.80 07/26/2014 1036   BILITOT 0.9 04/26/2014 0848         Impression and Plan: Mr. Knippenberg is 81 year old white male. He has both myelodysplasia and hemochromatosis. He has an incredible amount of iron. This is probably due to hemachromatosis in addition to ineffective erythropoiesis.  His iron levels are  coming down. We will see they are today. Again, I suspect that his iron levels probably always will be on the high side.  I will continue to phlebotomize him.  I Aranesp certainly has helped. We do not have to give him any today. We will continue to phlebotomize him.  I want to see him back in one more month.  I think that he always will have some degree of iron overload. Given his age, I really don't think we have to get too aggressive with trying to get his iron levels down. Because of the underlying myelodysplasia, he has a certain degree of ineffective erythropoiesis.   Volanda Napoleon, MD 2/23/201611:45 AM

## 2014-07-26 NOTE — Patient Instructions (Signed)

## 2014-07-29 ENCOUNTER — Telehealth: Payer: Self-pay | Admitting: Hematology & Oncology

## 2014-07-29 NOTE — Telephone Encounter (Signed)
Left pt message with 3-16 appointment

## 2014-08-05 ENCOUNTER — Encounter: Payer: Self-pay | Admitting: Vascular Surgery

## 2014-08-17 ENCOUNTER — Ambulatory Visit (HOSPITAL_BASED_OUTPATIENT_CLINIC_OR_DEPARTMENT_OTHER): Payer: Medicare Other

## 2014-08-17 ENCOUNTER — Ambulatory Visit (HOSPITAL_BASED_OUTPATIENT_CLINIC_OR_DEPARTMENT_OTHER): Payer: Medicare Other | Admitting: Family

## 2014-08-17 ENCOUNTER — Encounter: Payer: Self-pay | Admitting: Family

## 2014-08-17 ENCOUNTER — Other Ambulatory Visit: Payer: Medicare Other | Admitting: Lab

## 2014-08-17 ENCOUNTER — Ambulatory Visit: Payer: Medicare Other | Admitting: Family

## 2014-08-17 ENCOUNTER — Other Ambulatory Visit (HOSPITAL_BASED_OUTPATIENT_CLINIC_OR_DEPARTMENT_OTHER): Payer: Medicare Other | Admitting: Lab

## 2014-08-17 VITALS — BP 107/60 | HR 66 | Temp 97.8°F | Resp 18 | Ht 69.0 in | Wt 191.0 lb

## 2014-08-17 DIAGNOSIS — D46Z Other myelodysplastic syndromes: Secondary | ICD-10-CM

## 2014-08-17 DIAGNOSIS — G8929 Other chronic pain: Secondary | ICD-10-CM

## 2014-08-17 DIAGNOSIS — D469 Myelodysplastic syndrome, unspecified: Secondary | ICD-10-CM

## 2014-08-17 DIAGNOSIS — D4620 Refractory anemia with excess of blasts, unspecified: Secondary | ICD-10-CM

## 2014-08-17 DIAGNOSIS — G629 Polyneuropathy, unspecified: Secondary | ICD-10-CM

## 2014-08-17 LAB — MANUAL DIFFERENTIAL (CHCC SATELLITE)
ALC: 1 10*3/uL (ref 0.9–3.3)
ANC (CHCC MAN DIFF): 0.6 10*3/uL — AB (ref 1.5–6.5)
BASO: 1 % (ref 0–2)
Band Neutrophils: 1 % (ref 0–10)
LYMPH: 51 % — ABNORMAL HIGH (ref 14–48)
MONO: 15 % — ABNORMAL HIGH (ref 0–13)
PLT EST ~~LOC~~: DECREASED
SEG: 29 % — ABNORMAL LOW (ref 40–75)
Variant Lymph: 3 % — ABNORMAL HIGH (ref 0–0)

## 2014-08-17 LAB — CBC WITH DIFFERENTIAL (CANCER CENTER ONLY)
HCT: 34.5 % — ABNORMAL LOW (ref 38.7–49.9)
HGB: 11.3 g/dL — ABNORMAL LOW (ref 13.0–17.1)
MCH: 37.5 pg — ABNORMAL HIGH (ref 28.0–33.4)
MCHC: 32.8 g/dL (ref 32.0–35.9)
MCV: 115 fL — ABNORMAL HIGH (ref 82–98)
PLATELETS: 105 10*3/uL — AB (ref 145–400)
RBC: 3.01 10*6/uL — AB (ref 4.20–5.70)
RDW: 17.4 % — ABNORMAL HIGH (ref 11.1–15.7)
WBC: 1.9 10*3/uL — AB (ref 4.0–10.0)

## 2014-08-17 LAB — CMP (CANCER CENTER ONLY)
ALT(SGPT): 27 U/L (ref 10–47)
AST: 27 U/L (ref 11–38)
Albumin: 3.2 g/dL — ABNORMAL LOW (ref 3.3–5.5)
Alkaline Phosphatase: 70 U/L (ref 26–84)
BUN: 13 mg/dL (ref 7–22)
CALCIUM: 8.5 mg/dL (ref 8.0–10.3)
CHLORIDE: 104 meq/L (ref 98–108)
CO2: 29 mEq/L (ref 18–33)
CREATININE: 1.1 mg/dL (ref 0.6–1.2)
Glucose, Bld: 108 mg/dL (ref 73–118)
Potassium: 4.2 mEq/L (ref 3.3–4.7)
Sodium: 144 mEq/L (ref 128–145)
Total Bilirubin: 0.9 mg/dl (ref 0.20–1.60)
Total Protein: 8.1 g/dL (ref 6.4–8.1)

## 2014-08-17 LAB — RETICULOCYTES (CHCC)
ABS Retic: 90.5 10*3/uL (ref 19.0–186.0)
RBC.: 3.12 MIL/uL — AB (ref 4.22–5.81)
RETIC CT PCT: 2.9 % — AB (ref 0.4–2.3)

## 2014-08-17 LAB — CHCC SATELLITE - SMEAR

## 2014-08-17 NOTE — Patient Instructions (Signed)

## 2014-08-17 NOTE — Progress Notes (Signed)
Hematology and Oncology Follow Up Visit  Andrew [name] [id number] [birth year] 81 y.o. 08/17/2014   Principle Diagnosis:  Hemachromatosis (homozygous for C282Y mutation) Refractory anemia with multilineage dysplasia  Current Therapy:   Phlebotomy to get the ferritin down to less than 100 Aranesp 300 g subcutaneous for hemoglobin less than 11    Interim History: Andrew [name] is here today with his wife for a follow-up and phlebotomy. He is feeling much better. He denies fever, chills, n/v, cough, rash, headache, dizziness, blurred vision, SOB, chest pain, palpitations, abdominal pain, diarrhea, constipation, blood in urine or stool. His last ferritin was 1,972 and iron saturation 97%.  No swelling or tenderness in his extremities. He has chronic pain and neuropathy in his legs and feet from previous nerve damage. This is a little better. His appetite is good and he is staying hydrated. His weight is stable.   Medications:    Medication List       This list is accurate as of: 08/17/14  1:39 PM.  Always use your most recent med list.               doxazosin 4 MG tablet  Commonly known as:  CARDURA  Take 4 mg by mouth at bedtime.     escitalopram 10 MG tablet  Commonly known as:  LEXAPRO  Take 1 tablet (10 mg total) by mouth daily.     finasteride 5 MG tablet  Commonly known as:  PROSCAR  Take 5 mg by mouth daily.     gabapentin 100 MG capsule  Commonly known as:  NEURONTIN  Take 1 capsule (100 mg total) by mouth 3 (three) times daily.     lactulose 10 GM/15ML solution  Commonly known as:  CHRONULAC  Take 15 mLs (10 g total) by mouth 3 (three) times daily. As needed.     lovastatin 20 MG tablet  Commonly known as:  MEVACOR  Take 20 mg by mouth at bedtime.     REFRESH OP  Place 1 drop into both eyes daily as needed (dry eyes).     SYNTHROID 137 MCG tablet  Generic drug:  levothyroxine  Take 137 mcg by mouth daily before breakfast.        Allergies: No  Known Allergies  Past Medical History, Surgical history, Social history, and Family History were reviewed and updated.  Review of Systems: All other 10 point review of systems is negative.   Physical Exam:  vitals were not taken for this visit.  Wt Readings from Last 3 Encounters:  07/26/14 190 lb (86.183 kg)  06/14/14 191 lb (86.637 kg)  05/17/14 193 lb (87.544 kg)    Ocular: Sclerae unicteric, pupils equal, round and reactive to light Ear-nose-throat: Oropharynx clear, dentition fair Lymphatic: No cervical or supraclavicular adenopathy Lungs no rales or rhonchi, good excursion bilaterally Heart regular rate and rhythm, no murmur appreciated Abd soft, nontender, positive bowel sounds MSK no focal spinal tenderness, no joint edema Neuro: non-focal, well-oriented, appropriate affect  Lab Results  Component Value Date   WBC 2.3* 07/26/2014   HGB 11.3* 07/26/2014   HCT 34.5* 07/26/2014   MCV 113* 07/26/2014   PLT 115* 07/26/2014   Lab Results  Component Value Date   FERRITIN 1,972 Result Confirmed by Automated Dilution.* 07/26/2014   IRON 149 07/26/2014   TIBC 154* 07/26/2014   UIBC 5* 07/26/2014   IRONPCTSAT 97* 07/26/2014   Lab Results  Component Value Date   RETICCTPCT 3.9* 07/26/2014   RBC 3.06*  07/26/2014   RBC 3.14* 07/26/2014   RETICCTABS 122.5 07/26/2014   No results found for: KPAFRELGTCHN, LAMBDASER, KAPLAMBRATIO No results found for: IGGSERUM, IGA, IGMSERUM No results found for: Odetta Pink, SPEI   Chemistry      Component Value Date/Time   NA 144 07/26/2014 1036   NA 138 04/26/2014 0848   K 4.2 07/26/2014 1036   K 4.1 04/26/2014 0848   CL 103 07/26/2014 1036   CL 104 04/26/2014 0848   CO2 26 07/26/2014 1036   CO2 27 04/26/2014 0848   BUN 15 07/26/2014 1036   BUN 11 04/26/2014 0848   CREATININE 1.1 07/26/2014 1036   CREATININE 0.75 04/26/2014 0848      Component Value Date/Time   CALCIUM  8.9 07/26/2014 1036   CALCIUM 8.8 04/26/2014 0848   ALKPHOS 76 07/26/2014 1036   ALKPHOS 66 04/26/2014 0848   AST 26 07/26/2014 1036   AST 35 04/26/2014 0848   ALT 24 07/26/2014 1036   ALT 34 04/26/2014 0848   BILITOT 0.80 07/26/2014 1036   BILITOT 0.9 04/26/2014 0848     Impression and Plan: Andrew [name] is 81 year old white male with both myelodysplasia and hemochromatosis. He is asymptomatic at this time.  In February, his ferritin was 1,972 and iron saturation 97%.  His Hgb today is 11.3 MCV 11 platelets 105. We will see what his iron studies show.  We will phlebotomize him today.  He does not need Aranesp at this time.  We will see him back in 1 month for labs and follow-up.  He knows to call here with any questions or concerns. We can certainly see him sooner if need be.   Andrew Bottom, NP 3/16/20161:39 PM

## 2014-08-17 NOTE — Progress Notes (Signed)
Andrew [name] presents today for phlebotomy per MD orders. Phlebotomy procedure started at 1425 and ended at 1435. 500 ml removed. Patient observed for 30 minutes after procedure without any incident. Patient tolerated procedure well. IV needle removed intact.

## 2014-08-18 LAB — IRON AND TIBC CHCC
%SAT: 96 % — ABNORMAL HIGH (ref 20–55)
IRON: 147 ug/dL (ref 42–163)
TIBC: 153 ug/dL — AB (ref 202–409)
UIBC: 6 ug/dL — AB (ref 117–376)

## 2014-08-18 LAB — FERRITIN CHCC: Ferritin: 1681 ng/ml — ABNORMAL HIGH (ref 22–316)

## 2014-08-25 ENCOUNTER — Other Ambulatory Visit: Payer: Self-pay | Admitting: Family

## 2014-09-14 ENCOUNTER — Other Ambulatory Visit (HOSPITAL_BASED_OUTPATIENT_CLINIC_OR_DEPARTMENT_OTHER): Payer: Medicare Other

## 2014-09-14 ENCOUNTER — Ambulatory Visit (HOSPITAL_BASED_OUTPATIENT_CLINIC_OR_DEPARTMENT_OTHER): Payer: Medicare Other | Admitting: Hematology & Oncology

## 2014-09-14 ENCOUNTER — Encounter: Payer: Self-pay | Admitting: Hematology & Oncology

## 2014-09-14 ENCOUNTER — Ambulatory Visit (HOSPITAL_BASED_OUTPATIENT_CLINIC_OR_DEPARTMENT_OTHER): Payer: Medicare Other

## 2014-09-14 VITALS — BP 108/63 | HR 68 | Temp 98.0°F | Resp 20 | Ht 69.0 in | Wt 194.0 lb

## 2014-09-14 DIAGNOSIS — D4620 Refractory anemia with excess of blasts, unspecified: Secondary | ICD-10-CM

## 2014-09-14 DIAGNOSIS — D469 Myelodysplastic syndrome, unspecified: Secondary | ICD-10-CM

## 2014-09-14 LAB — MANUAL DIFFERENTIAL (CHCC SATELLITE)
ALC: 0.2 10*3/uL — AB (ref 0.9–3.3)
ANC (CHCC MAN DIFF): 1.6 10*3/uL (ref 1.5–6.5)
BAND NEUTROPHILS: 64 % — AB (ref 0–10)
LYMPH: 12 % — AB (ref 14–48)
MONO: 1 % (ref 0–13)
PLT EST ~~LOC~~: DECREASED
SEG: 23 % — ABNORMAL LOW (ref 40–75)

## 2014-09-14 LAB — CMP (CANCER CENTER ONLY)
ALT(SGPT): 33 U/L (ref 10–47)
AST: 34 U/L (ref 11–38)
Albumin: 3.4 g/dL (ref 3.3–5.5)
Alkaline Phosphatase: 62 U/L (ref 26–84)
BILIRUBIN TOTAL: 1 mg/dL (ref 0.20–1.60)
BUN, Bld: 14 mg/dL (ref 7–22)
CALCIUM: 8.7 mg/dL (ref 8.0–10.3)
CO2: 29 mEq/L (ref 18–33)
Chloride: 105 mEq/L (ref 98–108)
Creat: 1.1 mg/dl (ref 0.6–1.2)
Glucose, Bld: 90 mg/dL (ref 73–118)
Potassium: 4.2 mEq/L (ref 3.3–4.7)
SODIUM: 144 meq/L (ref 128–145)
TOTAL PROTEIN: 8 g/dL (ref 6.4–8.1)

## 2014-09-14 LAB — RETICULOCYTES (CHCC)
ABS Retic: 76 10*3/uL (ref 19.0–186.0)
RBC.: 3.04 MIL/uL — ABNORMAL LOW (ref 4.22–5.81)
RETIC CT PCT: 2.5 % — AB (ref 0.4–2.3)

## 2014-09-14 LAB — CHCC SATELLITE - SMEAR

## 2014-09-14 LAB — CBC WITH DIFFERENTIAL (CANCER CENTER ONLY)
HEMATOCRIT: 34.4 % — AB (ref 38.7–49.9)
HEMOGLOBIN: 11.2 g/dL — AB (ref 13.0–17.1)
MCH: 37.1 pg — AB (ref 28.0–33.4)
MCHC: 32.6 g/dL (ref 32.0–35.9)
MCV: 114 fL — AB (ref 82–98)
Platelets: 92 10*3/uL — ABNORMAL LOW (ref 145–400)
RBC: 3.02 10*6/uL — AB (ref 4.20–5.70)
RDW: 16.8 % — ABNORMAL HIGH (ref 11.1–15.7)
WBC: 1.9 10*3/uL — AB (ref 4.0–10.0)

## 2014-09-14 NOTE — Progress Notes (Signed)
Andrew [name] presents today for phlebotomy per MD orders. Phlebotomy procedure started at 1315 and ended at 1340. Approximately 500 mls removed. Patient observed for 30 minutes after procedure without any incident. Patient tolerated procedure well. IV needle removed intact.

## 2014-09-14 NOTE — Progress Notes (Signed)
Hematology and Oncology Follow Up Visit  XADRIAN CRAIGHEAD [id number] 1932/09/17 81 y.o. 09/14/2014   Principle Diagnosis:   Hemachromatosis (homozygous for C282Y mutation)  Refractory anemia with multilineage dysplasia)  Current Therapy:   Phlebotomy to get the ferritin down to less than 100 Aranesp 300 g subcutaneous for hemoglobin less than 11    Interim History:  Mr.  Persichetti is back for followup. He actually looks pretty good. He's really not had much in the way complaints of fatigue. He does rest quite a bit.  He does have some issues with depression. This seems to be a little bit better.  His last ferritin was 1681. Again this is been coming down slowly but surely.  We've not had to transfuse him. Aranesp does seem to help when his hemoglobin gets below 11.  He has had no bleeding. He's had constipation. Lactulose does seem to help this. He's had no fever. He's had a good appetite. He's had no nausea or vomiting. Medications:  Current outpatient prescriptions:  .  doxazosin (CARDURA) 4 MG tablet, Take 4 mg by mouth at bedtime.  , Disp: , Rfl:  .  escitalopram (LEXAPRO) 10 MG tablet, Take 1 tablet (10 mg total) by mouth daily., Disp: 30 tablet, Rfl: 6 .  finasteride (PROSCAR) 5 MG tablet, Take 5 mg by mouth daily.  , Disp: , Rfl:  .  lactulose (CHRONULAC) 10 GM/15ML solution, Take 15 mLs (10 g total) by mouth 3 (three) times daily. As needed., Disp: 480 mL, Rfl: 3 .  levothyroxine (SYNTHROID) 137 MCG tablet, Take 137 mcg by mouth daily before breakfast., Disp: , Rfl:  .  lovastatin (MEVACOR) 20 MG tablet, Take 20 mg by mouth at bedtime. , Disp: , Rfl:  .  Polyvinyl Alcohol-Povidone (REFRESH OP), Place 1 drop into both eyes daily as needed (dry eyes)., Disp: , Rfl:   Allergies: No Known Allergies  Past Medical History, Surgical history, Social history, and Family History were reviewed and updated.  Review of Systems: As above  Physical Exam:  height is 5\' 9"  (1.753  m) and weight is 194 lb (87.998 kg). His oral temperature is 98 F (36.7 C). His blood pressure is 108/63 and his pulse is 68. His respiration is 20.   Elderly white woman. Head and neck exam shows no ocular or oral lesions. There are no palpable cervical or supraclavicular lymph nodes. Lungs are clear. Cardiac exam regular rate and rhythm with no murmurs, rubs or bruits.. Abdomen is soft. He has good bowel sounds. There is no fluid wave. There is a well-healed laparotomy scar from his aneurysm repair. There is no palpable liver or spleen tip. Extremities shows no clubbing, cyanosis or edema. Skin exam no rashes. He has some scattered ecchymoses. Neurological exam shows some slight decrease in sensation in his legs.  Lab Results  Component Value Date   WBC 1.9* 09/14/2014   HGB 11.2* 09/14/2014   HCT 34.4* 09/14/2014   MCV 114* 09/14/2014   PLT 92* 09/14/2014     Chemistry      Component Value Date/Time   NA 144 09/14/2014 1145   NA 138 04/26/2014 0848   K 4.2 09/14/2014 1145   K 4.1 04/26/2014 0848   CL 105 09/14/2014 1145   CL 104 04/26/2014 0848   CO2 29 09/14/2014 1145   CO2 27 04/26/2014 0848   BUN 14 09/14/2014 1145   BUN 11 04/26/2014 0848   CREATININE 1.1 09/14/2014 1145   CREATININE 0.75 04/26/2014  0848      Component Value Date/Time   CALCIUM 8.7 09/14/2014 1145   CALCIUM 8.8 04/26/2014 0848   ALKPHOS 62 09/14/2014 1145   ALKPHOS 66 04/26/2014 0848   AST 34 09/14/2014 1145   AST 35 04/26/2014 0848   ALT 33 09/14/2014 1145   ALT 34 04/26/2014 0848   BILITOT 1.00 09/14/2014 1145   BILITOT 0.9 04/26/2014 0848         Impression and Plan: Mr. Noone is 81 year old white male. He has both myelodysplasia and hemochromatosis. He has an incredible amount of iron. This is probably due to hemachromatosis in addition to ineffective erythropoiesis.  His iron levels are coming down. Going back to November, his levels come down quite nicely.  I don't think we will  ever get his iron level down to normal. However, if we can just keep the iron level trending downward, I think we will do well for him.  We will continue to phlebotomize him. I think that every 3-4 weeks phlebotomies will be appropriate for right now.  He does not need any Aranesp today.  We will get him back in 4 weeks.   Volanda Napoleon, MD 4/13/20161:09 PM

## 2014-09-14 NOTE — Patient Instructions (Signed)

## 2014-09-15 LAB — IRON AND TIBC CHCC
%SAT: 97 % — AB (ref 20–55)
Iron: 156 ug/dL (ref 42–163)
TIBC: 160 ug/dL — ABNORMAL LOW (ref 202–409)
UIBC: 4 ug/dL — AB (ref 117–376)

## 2014-09-15 LAB — FERRITIN CHCC: Ferritin: 1715 ng/ml — ABNORMAL HIGH (ref 22–316)

## 2014-10-12 ENCOUNTER — Ambulatory Visit (HOSPITAL_BASED_OUTPATIENT_CLINIC_OR_DEPARTMENT_OTHER): Payer: Medicare Other | Admitting: Family

## 2014-10-12 ENCOUNTER — Ambulatory Visit (HOSPITAL_BASED_OUTPATIENT_CLINIC_OR_DEPARTMENT_OTHER): Payer: Medicare Other

## 2014-10-12 ENCOUNTER — Other Ambulatory Visit (HOSPITAL_BASED_OUTPATIENT_CLINIC_OR_DEPARTMENT_OTHER): Payer: Medicare Other

## 2014-10-12 VITALS — BP 110/64 | HR 71 | Temp 98.0°F | Resp 16 | Wt 191.0 lb

## 2014-10-12 DIAGNOSIS — G8929 Other chronic pain: Secondary | ICD-10-CM

## 2014-10-12 DIAGNOSIS — D4620 Refractory anemia with excess of blasts, unspecified: Secondary | ICD-10-CM

## 2014-10-12 DIAGNOSIS — D464 Refractory anemia, unspecified: Secondary | ICD-10-CM

## 2014-10-12 DIAGNOSIS — D46Z Other myelodysplastic syndromes: Secondary | ICD-10-CM

## 2014-10-12 DIAGNOSIS — G629 Polyneuropathy, unspecified: Secondary | ICD-10-CM | POA: Diagnosis not present

## 2014-10-12 LAB — COMPREHENSIVE METABOLIC PANEL
ALBUMIN: 3.5 g/dL (ref 3.5–5.2)
ALK PHOS: 63 U/L (ref 39–117)
ALT: 27 U/L (ref 0–53)
AST: 22 U/L (ref 0–37)
BUN: 12 mg/dL (ref 6–23)
CALCIUM: 8.2 mg/dL — AB (ref 8.4–10.5)
CHLORIDE: 107 meq/L (ref 96–112)
CO2: 25 mEq/L (ref 19–32)
Creatinine, Ser: 0.89 mg/dL (ref 0.50–1.35)
Glucose, Bld: 87 mg/dL (ref 70–99)
Potassium: 4.1 mEq/L (ref 3.5–5.3)
Sodium: 139 mEq/L (ref 135–145)
TOTAL PROTEIN: 7 g/dL (ref 6.0–8.3)
Total Bilirubin: 0.8 mg/dL (ref 0.2–1.2)

## 2014-10-12 LAB — MANUAL DIFFERENTIAL (CHCC SATELLITE)
ALC: 1.1 10*3/uL (ref 0.9–3.3)
ANC (CHCC MAN DIFF): 1 10*3/uL — AB (ref 1.5–6.5)
LYMPH: 47 % (ref 14–48)
MONO: 12 % (ref 0–13)
PLT EST ~~LOC~~: DECREASED
SEG: 41 % (ref 40–75)
nRBC: 1 % — ABNORMAL HIGH (ref 0–0)

## 2014-10-12 LAB — CBC WITH DIFFERENTIAL (CANCER CENTER ONLY)
HCT: 34 % — ABNORMAL LOW (ref 38.7–49.9)
HGB: 11.4 g/dL — ABNORMAL LOW (ref 13.0–17.1)
MCH: 37.5 pg — AB (ref 28.0–33.4)
MCHC: 33.5 g/dL (ref 32.0–35.9)
MCV: 112 fL — AB (ref 82–98)
PLATELETS: 88 10*3/uL — AB (ref 145–400)
RBC: 3.04 10*6/uL — ABNORMAL LOW (ref 4.20–5.70)
RDW: 16.8 % — ABNORMAL HIGH (ref 11.1–15.7)
WBC: 2.4 10*3/uL — ABNORMAL LOW (ref 4.0–10.0)

## 2014-10-12 LAB — RETICULOCYTES (CHCC)
ABS RETIC: 91.4 10*3/uL (ref 19.0–186.0)
RBC.: 3.15 MIL/uL — ABNORMAL LOW (ref 4.22–5.81)
Retic Ct Pct: 2.9 % — ABNORMAL HIGH (ref 0.4–2.3)

## 2014-10-12 LAB — CHCC SATELLITE - SMEAR

## 2014-10-12 NOTE — Progress Notes (Signed)
Hematology and Oncology Follow Up Visit  Andrew [name] [id number] [birth year] 81 y.o. 10/12/2014   Principle Diagnosis:  Hemachromatosis (homozygous for C282Y mutation) Refractory anemia with multilineage dysplasia  Current Therapy:   Phlebotomy to get the ferritin down to less than 100 Aranesp 300 g subcutaneous for hemoglobin less than 11    Interim History: Andrew [name] is here today with his wife for a follow-up. His is regularly phlebotomized every 3-4 weeks for his hemochromatosis. His ferritin is chronically elevated his last one in April being 1,715 and iron saturation 97%.  He has not had a problem with refractory anemia for a while. He hasn't had Aranesp in a few months.  He is feeling much better and in much better spirits today. He denies fever, chills, n/v, cough, rash, headache, dizziness, blurred vision, SOB, chest pain, palpitations, abdominal pain, diarrhea, constipation, blood in urine or stool.  No swelling or tenderness in his extremities. He has chronic pain and neuropathy in his legs and feet from previous nerve damage. He does not want to take Neurontin or Lyrica due to the side effects and I also believe we have tried vitamin B 12 with him as well.  His appetite is good and he is staying well hydrated. His weight is stable.   Medications:    Medication List       This list is accurate as of: 10/12/14  4:46 PM.  Always use your most recent med list.               doxazosin 4 MG tablet  Commonly known as:  CARDURA  Take 4 mg by mouth at bedtime.     escitalopram 10 MG tablet  Commonly known as:  LEXAPRO  Take 1 tablet (10 mg total) by mouth daily.     finasteride 5 MG tablet  Commonly known as:  PROSCAR  Take 5 mg by mouth daily.     lactulose 10 GM/15ML solution  Commonly known as:  CHRONULAC  Take 15 mLs (10 g total) by mouth 3 (three) times daily. As needed.     lovastatin 20 MG tablet  Commonly known as:  MEVACOR  Take 20 mg by mouth  at bedtime.     REFRESH OP  Place 1 drop into both eyes daily as needed (dry eyes).     SYNTHROID 137 MCG tablet  Generic drug:  levothyroxine  Take 137 mcg by mouth daily before breakfast.        Allergies: No Known Allergies  Past Medical History, Surgical history, Social history, and Family History were reviewed and updated.  Review of Systems: All other 10 point review of systems is negative.   Physical Exam:  weight is 191 lb (86.637 kg). His oral temperature is 98 F (36.7 C). His blood pressure is 110/64 and his pulse is 71. His respiration is 16.   Wt Readings from Last 3 Encounters:  10/12/14 191 lb (86.637 kg)  09/14/14 194 lb (87.998 kg)  08/17/14 191 lb (86.637 kg)    Ocular: Sclerae unicteric, pupils equal, round and reactive to light Ear-nose-throat: Oropharynx clear, dentition fair Lymphatic: No cervical or supraclavicular adenopathy Lungs no rales or rhonchi, good excursion bilaterally Heart regular rate and rhythm, no murmur appreciated Abd soft, nontender, positive bowel sounds MSK no focal spinal tenderness, no joint edema Neuro: non-focal, well-oriented, appropriate affect  Lab Results  Component Value Date   WBC 2.4* 10/12/2014   HGB 11.4* 10/12/2014   HCT 34.0* 10/12/2014  MCV 112* 10/12/2014   PLT 88* 10/12/2014   Lab Results  Component Value Date   FERRITIN 1,715* 09/14/2014   IRON 156 09/14/2014   TIBC 160* 09/14/2014   UIBC 4* 09/14/2014   IRONPCTSAT 97* 09/14/2014   Lab Results  Component Value Date   RETICCTPCT 2.5* 09/14/2014   RBC 3.04* 10/12/2014   RETICCTABS 76.0 09/14/2014   No results found for: KPAFRELGTCHN, LAMBDASER, KAPLAMBRATIO No results found for: IGGSERUM, IGA, IGMSERUM No results found for: Odetta Pink, SPEI   Chemistry      Component Value Date/Time   NA 144 09/14/2014 1145   NA 138 04/26/2014 0848   K 4.2 09/14/2014 1145   K 4.1 04/26/2014 0848    CL 105 09/14/2014 1145   CL 104 04/26/2014 0848   CO2 29 09/14/2014 1145   CO2 27 04/26/2014 0848   BUN 14 09/14/2014 1145   BUN 11 04/26/2014 0848   CREATININE 1.1 09/14/2014 1145   CREATININE 0.75 04/26/2014 0848      Component Value Date/Time   CALCIUM 8.7 09/14/2014 1145   CALCIUM 8.8 04/26/2014 0848   ALKPHOS 62 09/14/2014 1145   ALKPHOS 66 04/26/2014 0848   AST 34 09/14/2014 1145   AST 35 04/26/2014 0848   ALT 33 09/14/2014 1145   ALT 34 04/26/2014 0848   BILITOT 1.00 09/14/2014 1145   BILITOT 0.9 04/26/2014 0848     Impression and Plan: Andrew. [name] is 81 year old white male with both myelodysplasia and hemochromatosis. He is doing well and is asymptomatic at this time. Due to his chronically elevated ferritin we phlebotomize him every 3-4 weeks.  We will phlebotomize him today.  His Hgb is 11.4 so he will not need Aranesp.    We will see him back in 1 month for labs and follow-up.  He will contact us with any questions or concerns. We can certainly see him sooner if need be.   Eliezer Bottom, NP 5/11/20164:46 PM

## 2014-10-12 NOTE — Patient Instructions (Signed)

## 2014-10-13 LAB — IRON AND TIBC CHCC
%SAT: 95 % — ABNORMAL HIGH (ref 20–55)
Iron: 138 ug/dL (ref 42–163)
TIBC: 145 ug/dL — ABNORMAL LOW (ref 202–409)
UIBC: 7 ug/dL — ABNORMAL LOW (ref 117–376)

## 2014-10-13 LAB — FERRITIN CHCC

## 2014-11-07 ENCOUNTER — Encounter: Payer: Self-pay | Admitting: Vascular Surgery

## 2014-11-08 ENCOUNTER — Encounter: Payer: Self-pay | Admitting: Vascular Surgery

## 2014-11-08 ENCOUNTER — Ambulatory Visit
Admission: RE | Admit: 2014-11-08 | Discharge: 2014-11-08 | Disposition: A | Discharge status: Home / Self Care | Payer: Medicare Other | Source: Ambulatory Visit | Attending: Vascular Surgery | Admitting: Vascular Surgery

## 2014-11-08 ENCOUNTER — Ambulatory Visit (INDEPENDENT_AMBULATORY_CARE_PROVIDER_SITE_OTHER): Payer: Medicare Other | Admitting: Vascular Surgery

## 2014-11-08 VITALS — BP 110/67 | HR 68 | Resp 18 | Ht 69.0 in | Wt 191.8 lb

## 2014-11-08 DIAGNOSIS — Z48812 Encounter for surgical aftercare following surgery on the circulatory system: Secondary | ICD-10-CM

## 2014-11-08 DIAGNOSIS — I714 Abdominal aortic aneurysm, without rupture, unspecified: Secondary | ICD-10-CM

## 2014-11-08 MED ORDER — IOPAMIDOL (ISOVUE-370) INJECTION 76%
75.0000 mL | Freq: Once | INTRAVENOUS | Status: AC | PRN
  Administered 2014-11-08: 75 mL via INTRAVENOUS

## 2014-11-08 NOTE — Progress Notes (Signed)
Patient is today for follow-up of his stent graft repair of abdominal aortic aneurysm November 2015. He did well the hospital was discharged home on postoperative day 1. He continues to have a significant disability related to severe degenerative disc disease and weakness related to this. He looks quite good today and is here today with his wife. He reports no new medical difficulties.  Past Medical History  Diagnosis Date  . Leg pain   . AAA (abdominal aortic aneurysm)   . Foot pain     while lying flat  . Thyroid disease   . Hereditary hemochromatosis 12/24/2013  . MDS (myelodysplastic syndrome), low grade 12/24/2013  . Anemia   . Hypothyroidism   . Depression   . Chronic kidney disease     has to self catherize    History  Substance Use Topics  . Smoking status: Former Smoker -- 2.00 packs/day for 25 years    Types: Cigarettes    Start date: 07/01/1960    Quit date: 06/03/1985  . Smokeless tobacco: Never Used     Comment: quit smoking 29 years ago  . Alcohol Use: No    Family History  Problem Relation Age of Onset  . Cancer Mother   . Diabetes Mother   . Cancer Brother     No Known Allergies   Current outpatient prescriptions:  .  doxazosin (CARDURA) 4 MG tablet, Take 4 mg by mouth at bedtime.  , Disp: , Rfl:  .  escitalopram (LEXAPRO) 10 MG tablet, Take 1 tablet (10 mg total) by mouth daily., Disp: 30 tablet, Rfl: 6 .  finasteride (PROSCAR) 5 MG tablet, Take 5 mg by mouth daily.  , Disp: , Rfl:  .  lactulose (CHRONULAC) 10 GM/15ML solution, Take 15 mLs (10 g total) by mouth 3 (three) times daily. As needed., Disp: 480 mL, Rfl: 3 .  levothyroxine (SYNTHROID) 137 MCG tablet, Take 137 mcg by mouth daily before breakfast., Disp: , Rfl:  .  lovastatin (MEVACOR) 20 MG tablet, Take 20 mg by mouth at bedtime. , Disp: , Rfl:  .  Polyvinyl Alcohol-Povidone (REFRESH OP), Place 1 drop into both eyes daily as needed (dry eyes)., Disp: , Rfl:   Filed Vitals:   11/08/14 1249  BP:  110/67  Pulse: 68  Resp: 18  Height: 5\' 9"  (1.753 m)  Weight: 191 lb 12.8 oz (87 kg)    Body mass index is 28.31 kg/(m^2).   Physical exam: Well-developed well-nourished acute distress Carotid arteries without bruits bilaterally Radial pulses 2+ bilaterally. He does have palpable popliteal pulses bilaterally. Abdomen soft nontender no aneurysm palpable  CT scan was reviewed with the patient from today. This shows stable stent graft size at 6 cm. No evidence of shift of the device. Does have a stable type II endoleak.  Impression and plan stable status post stent graft repair of abdominal aortic aneurysm November 2015. We'll continue his usual activities. We will see him again in one year with ultrasound follow-up

## 2014-11-09 NOTE — Addendum Note (Signed)
Addended by: Dorthula Rue L on: 11/09/2014 03:28 PM   Modules accepted: Orders

## 2014-11-10 ENCOUNTER — Encounter: Payer: Self-pay | Admitting: Hematology & Oncology

## 2014-11-10 ENCOUNTER — Ambulatory Visit (HOSPITAL_BASED_OUTPATIENT_CLINIC_OR_DEPARTMENT_OTHER): Payer: Medicare Other

## 2014-11-10 ENCOUNTER — Other Ambulatory Visit (HOSPITAL_BASED_OUTPATIENT_CLINIC_OR_DEPARTMENT_OTHER): Payer: Medicare Other

## 2014-11-10 ENCOUNTER — Ambulatory Visit (HOSPITAL_BASED_OUTPATIENT_CLINIC_OR_DEPARTMENT_OTHER): Payer: Medicare Other | Admitting: Hematology & Oncology

## 2014-11-10 VITALS — BP 105/51 | HR 69 | Temp 98.1°F | Wt 192.0 lb

## 2014-11-10 DIAGNOSIS — D4620 Refractory anemia with excess of blasts, unspecified: Secondary | ICD-10-CM

## 2014-11-10 DIAGNOSIS — D46Z Other myelodysplastic syndromes: Secondary | ICD-10-CM | POA: Diagnosis not present

## 2014-11-10 LAB — COMPREHENSIVE METABOLIC PANEL
ALT: 22 U/L (ref 0–53)
AST: 24 U/L (ref 0–37)
Albumin: 3.6 g/dL (ref 3.5–5.2)
Alkaline Phosphatase: 65 U/L (ref 39–117)
BILIRUBIN TOTAL: 0.9 mg/dL (ref 0.2–1.2)
BUN: 11 mg/dL (ref 6–23)
CALCIUM: 8.6 mg/dL (ref 8.4–10.5)
CHLORIDE: 106 meq/L (ref 96–112)
CO2: 27 meq/L (ref 19–32)
CREATININE: 0.7 mg/dL (ref 0.50–1.35)
Glucose, Bld: 92 mg/dL (ref 70–99)
Potassium: 3.9 mEq/L (ref 3.5–5.3)
Sodium: 139 mEq/L (ref 135–145)
TOTAL PROTEIN: 7.7 g/dL (ref 6.0–8.3)

## 2014-11-10 LAB — CBC WITH DIFFERENTIAL (CANCER CENTER ONLY)
HEMATOCRIT: 32.8 % — AB (ref 38.7–49.9)
HEMOGLOBIN: 10.9 g/dL — AB (ref 13.0–17.1)
MCH: 37.8 pg — ABNORMAL HIGH (ref 28.0–33.4)
MCHC: 33.2 g/dL (ref 32.0–35.9)
MCV: 114 fL — ABNORMAL HIGH (ref 82–98)
PLATELETS: 101 10*3/uL — AB (ref 145–400)
RBC: 2.88 10*6/uL — AB (ref 4.20–5.70)
RDW: 17.4 % — ABNORMAL HIGH (ref 11.1–15.7)
WBC: 2 10*3/uL — AB (ref 4.0–10.0)

## 2014-11-10 LAB — MANUAL DIFFERENTIAL (CHCC SATELLITE)
ALC: 1.2 10*3/uL (ref 0.9–3.3)
ANC (CHCC HP manual diff): 0.5 10*3/uL — ABNORMAL LOW (ref 1.5–6.5)
Band Neutrophils: 2 % (ref 0–10)
Eos: 1 % (ref 0–7)
LYMPH: 62 % — ABNORMAL HIGH (ref 14–48)
MONO: 10 % (ref 0–13)
PLT EST ~~LOC~~: DECREASED
SEG: 25 % — ABNORMAL LOW (ref 40–75)

## 2014-11-10 LAB — RETICULOCYTES (CHCC)
ABS Retic: 90.9 10*3/uL (ref 19.0–186.0)
RBC.: 3.03 MIL/uL — AB (ref 4.22–5.81)
Retic Ct Pct: 3 % — ABNORMAL HIGH (ref 0.4–2.3)

## 2014-11-10 LAB — CHCC SATELLITE - SMEAR

## 2014-11-10 NOTE — Patient Instructions (Signed)

## 2014-11-10 NOTE — Progress Notes (Signed)
Hematology and Oncology Follow Up Visit  Andrew [name] [id number] 01/19/1933 81 y.o. 11/10/2014   Principle Diagnosis:   Hemochromatosis (homozygous for C282Y mutation)  Refractory anemia with multilineage dysplasia)  Current Therapy:   Phlebotomy to get the ferritin down to less than 100 Aranesp 300 g subcutaneous for hemoglobin less than 11    Interim History:  [name] is back for followup. He actually looks pretty good.he got a good report from his vascular surgeon. He does not go back to see him for a year.  He's doing pretty well from my point of view regarding his phlebotomies. His last ferritin was 1600 back in early May. We will see what it is this time.  I would don't think we'll ever get him close to a ferritin of 100. I still think that it is all that important that we get him down that low.  He has responded nicely to Aranesp. We may have to give him a dose today.  He's had no bleeding. He's had no fever. He's had no nausea or vomiting. He's had no cough. He's had no leg swelling. He's had no rashes.  Overall, his performance status is ECOG 2     Medications:  Current outpatient prescriptions:  .  doxazosin (CARDURA) 4 MG tablet, Take 4 mg by mouth at bedtime.  , Disp: , Rfl:  .  finasteride (PROSCAR) 5 MG tablet, Take 5 mg by mouth daily.  , Disp: , Rfl:  .  lactulose (CHRONULAC) 10 GM/15ML solution, Take 15 mLs (10 g total) by mouth 3 (three) times daily. As needed., Disp: 480 mL, Rfl: 3 .  levothyroxine (SYNTHROID) 137 MCG tablet, Take 137 mcg by mouth daily before breakfast., Disp: , Rfl:  .  lovastatin (MEVACOR) 20 MG tablet, Take 20 mg by mouth at bedtime. , Disp: , Rfl:  .  Polyvinyl Alcohol-Povidone (REFRESH OP), Place 1 drop into both eyes daily as needed (dry eyes)., Disp: , Rfl:   Allergies: No Known Allergies  Past Medical History, Surgical history, Social history, and Family History were reviewed and updated.  Review of Systems: As  above  Physical Exam:  weight is 192 lb (87.091 kg). His oral temperature is 98.1 F (36.7 C). His blood pressure is 105/51 and his pulse is 69.   Elderly white woman. Head and neck exam shows no ocular or oral lesions. There are no palpable cervical or supraclavicular lymph nodes. Lungs are clear. Cardiac exam regular rate and rhythm with no murmurs, rubs or bruits.. Abdomen is soft. He has good bowel sounds. There is no fluid wave. There is a well-healed laparotomy scar from his aneurysm repair. There is no palpable liver or spleen tip. Extremities shows no clubbing, cyanosis or edema. Skin exam no rashes. He has some scattered ecchymoses. Neurological exam shows some slight decrease in sensation in his legs.  Lab Results  Component Value Date   WBC 2.0* 11/10/2014   HGB 10.9* 11/10/2014   HCT 32.8* 11/10/2014   MCV 114* 11/10/2014   PLT 101* 11/10/2014     Chemistry      Component Value Date/Time   NA 139 10/12/2014 1142   NA 144 09/14/2014 1145   K 4.1 10/12/2014 1142   K 4.2 09/14/2014 1145   CL 107 10/12/2014 1142   CL 105 09/14/2014 1145   CO2 25 10/12/2014 1142   CO2 29 09/14/2014 1145   BUN 12 10/12/2014 1142   BUN 14 09/14/2014 1145   CREATININE  0.89 10/12/2014 1142   CREATININE 1.1 09/14/2014 1145      Component Value Date/Time   CALCIUM 8.2* 10/12/2014 1142   CALCIUM 8.7 09/14/2014 1145   ALKPHOS 63 10/12/2014 1142   ALKPHOS 62 09/14/2014 1145   AST 22 10/12/2014 1142   AST 34 09/14/2014 1145   ALT 27 10/12/2014 1142   ALT 33 09/14/2014 1145   BILITOT 0.8 10/12/2014 1142   BILITOT 1.00 09/14/2014 1145         Impression and Plan: Andrew [name] is 81 year old white male. He has both myelodysplasia and hemochromatosis. He has an incredible amount of iron. This is probably due to hemachromatosis in addition to ineffective erythropoiesis.  His iron levels are coming down. Going back to November, his levels come down quite nicely.  I don't think we will  ever get his iron level down to normal. However, if we can just keep the iron level trending downward, I think we will do well for him.  We will continue to phlebotomize him. I think that we can bring his appointment out to every 6 weeks now.  I think he really is getting tired of having the come here. It is difficult for him and his wife to come here. I think we can make life a little bit easier for him and not compromise his care if he go to every 6 weeks.   Volanda Napoleon, MD 6/9/20165:55 PM

## 2014-11-11 LAB — IRON AND TIBC CHCC
%SAT: 99 % — ABNORMAL HIGH (ref 20–55)
IRON: 137 ug/dL (ref 42–163)
TIBC: 139 ug/dL — AB (ref 202–409)
UIBC: 2 ug/dL — ABNORMAL LOW (ref 117–376)

## 2014-11-11 LAB — FERRITIN CHCC: Ferritin: 1550 ng/ml — ABNORMAL HIGH (ref 22–316)

## 2014-12-12 ENCOUNTER — Other Ambulatory Visit: Payer: Self-pay | Admitting: Nurse Practitioner

## 2014-12-14 ENCOUNTER — Other Ambulatory Visit: Payer: Self-pay | Admitting: *Deleted

## 2014-12-14 DIAGNOSIS — D4620 Refractory anemia with excess of blasts, unspecified: Secondary | ICD-10-CM

## 2014-12-14 MED ORDER — LACTULOSE 10 GM/15ML PO SOLN: 10.0000 g | Freq: Three times a day (TID) | ORAL | Status: DC

## 2014-12-22 ENCOUNTER — Other Ambulatory Visit (HOSPITAL_BASED_OUTPATIENT_CLINIC_OR_DEPARTMENT_OTHER): Payer: Medicare Other

## 2014-12-22 ENCOUNTER — Ambulatory Visit (HOSPITAL_BASED_OUTPATIENT_CLINIC_OR_DEPARTMENT_OTHER): Payer: Medicare Other | Admitting: Family

## 2014-12-22 ENCOUNTER — Encounter: Payer: Self-pay | Admitting: Family

## 2014-12-22 ENCOUNTER — Ambulatory Visit (HOSPITAL_BASED_OUTPATIENT_CLINIC_OR_DEPARTMENT_OTHER): Payer: Medicare Other

## 2014-12-22 VITALS — BP 101/53 | HR 72 | Temp 97.9°F | Resp 20 | Ht 69.0 in | Wt 194.0 lb

## 2014-12-22 DIAGNOSIS — D4620 Refractory anemia with excess of blasts, unspecified: Secondary | ICD-10-CM

## 2014-12-22 DIAGNOSIS — D46Z Other myelodysplastic syndromes: Secondary | ICD-10-CM

## 2014-12-22 LAB — MANUAL DIFFERENTIAL (CHCC SATELLITE)
ALC: 1.3 10*3/uL (ref 0.9–3.3)
ANC (CHCC MAN DIFF): 0.9 10*3/uL — AB (ref 1.5–6.5)
LYMPH: 56 % — AB (ref 14–48)
MONO: 5 % (ref 0–13)
Metamyelocytes: 1 % — ABNORMAL HIGH (ref 0–0)
PLATELET MORPHOLOGY: NORMAL
PLT EST ~~LOC~~: DECREASED
SEG: 38 % — ABNORMAL LOW (ref 40–75)

## 2014-12-22 LAB — RETICULOCYTES (CHCC)
ABS RETIC: 86.4 10*3/uL (ref 19.0–186.0)
RBC.: 3.2 MIL/uL — ABNORMAL LOW (ref 4.22–5.81)
Retic Ct Pct: 2.7 % — ABNORMAL HIGH (ref 0.4–2.3)

## 2014-12-22 LAB — CBC WITH DIFFERENTIAL (CANCER CENTER ONLY)
HCT: 34.4 % — ABNORMAL LOW (ref 38.7–49.9)
HGB: 11.5 g/dL — ABNORMAL LOW (ref 13.0–17.1)
MCH: 37.8 pg — AB (ref 28.0–33.4)
MCHC: 33.4 g/dL (ref 32.0–35.9)
MCV: 113 fL — ABNORMAL HIGH (ref 82–98)
PLATELETS: 83 10*3/uL — AB (ref 145–400)
RBC: 3.04 10*6/uL — ABNORMAL LOW (ref 4.20–5.70)
RDW: 17.3 % — AB (ref 11.1–15.7)
WBC: 2.3 10*3/uL — AB (ref 4.0–10.0)

## 2014-12-22 NOTE — Progress Notes (Signed)
Baron Sane presents today for phlebotomy per MD orders. Phlebotomy procedure started at 1310 and ended at 1317. Approximately 500 mls removed. Patient observed for 30 minutes after procedure without any incident. Patient tolerated procedure well. IV needle removed intact.

## 2014-12-22 NOTE — Patient Instructions (Signed)

## 2014-12-22 NOTE — Progress Notes (Signed)
Hematology and Oncology Follow Up Visit  Andrew [name] [id number] 1933/04/29 81 y.o. 12/22/2014   Principle Diagnosis:  Hemachromatosis (homozygous for C282Y mutation) Refractory anemia with multilineage dysplasia  Current Therapy:   Phlebotomy to get the ferritin down to less than 100 Aranesp 300 g subcutaneous for hemoglobin less than 11    Interim History: Andrew [name] is here today with his wife for a follow-up. He is feeling good and has no complaints at this time.  His energy is a little improved and he is getting around much better. He is using a cane to ambulate. His Hgb is now up to 11.5 with an MCV of 113. His last ferritin level was 1,550. He denies fever, chills, n/v, cough, rash, headache, dizziness, blurred vision, SOB, chest pain, palpitations, abdominal pain, changes in bowel or bladder habits. No blood in his urine or stool.  No swelling or tenderness in his extremities. He has chronic pain and neuropathy in his legs and feet. This is unchanged.  His appetite is good and he is staying well hydrated. His weight is stable.   Medications:    Medication List       This list is accurate as of: 12/22/14 12:35 PM.  Always use your most recent med list.               doxazosin 4 MG tablet  Commonly known as:  CARDURA  Take 4 mg by mouth at bedtime.     finasteride 5 MG tablet  Commonly known as:  PROSCAR  Take 5 mg by mouth daily.     lactulose 10 GM/15ML solution  Commonly known as:  CHRONULAC  Take 15 mLs (10 g total) by mouth 3 (three) times daily. As needed.     lovastatin 20 MG tablet  Commonly known as:  MEVACOR  Take 20 mg by mouth at bedtime.     REFRESH OP  Place 1 drop into both eyes daily as needed (dry eyes).     SYNTHROID 137 MCG tablet  Generic drug:  levothyroxine  Take 137 mcg by mouth daily before breakfast.        Allergies: No Known Allergies  Past Medical History, Surgical history, Social history, and Family History were  reviewed and updated.  Review of Systems: All other 10 point review of systems is negative.   Physical Exam:  vitals were not taken for this visit.  Wt Readings from Last 3 Encounters:  11/10/14 192 lb (87.091 kg)  11/08/14 191 lb 12.8 oz (87 kg)  10/12/14 191 lb (86.637 kg)    Ocular: Sclerae unicteric, pupils equal, round and reactive to light Ear-nose-throat: Oropharynx clear, dentition fair Lymphatic: No cervical or supraclavicular adenopathy Lungs no rales or rhonchi, good excursion bilaterally Heart regular rate and rhythm, no murmur appreciated Abd soft, nontender, positive bowel sounds MSK no focal spinal tenderness, no joint edema Neuro: non-focal, well-oriented, appropriate affect  Lab Results  Component Value Date   WBC 2.0* 11/10/2014   HGB 10.9* 11/10/2014   HCT 32.8* 11/10/2014   MCV 114* 11/10/2014   PLT 101* 11/10/2014   Lab Results  Component Value Date   FERRITIN 1,550* 11/10/2014   IRON 137 11/10/2014   TIBC 139* 11/10/2014   UIBC 2* 11/10/2014   IRONPCTSAT 99* 11/10/2014   Lab Results  Component Value Date   RETICCTPCT 3.0* 11/10/2014   RBC 3.03* 11/10/2014   RETICCTABS 90.9 11/10/2014   No results found for: KPAFRELGTCHN, LAMBDASER, KAPLAMBRATIO No results found for:  IGGSERUM, IGA, IGMSERUM No results found for: Odetta Pink, SPEI   Chemistry      Component Value Date/Time   NA 139 11/10/2014 1337   NA 144 09/14/2014 1145   K 3.9 11/10/2014 1337   K 4.2 09/14/2014 1145   CL 106 11/10/2014 1337   CL 105 09/14/2014 1145   CO2 27 11/10/2014 1337   CO2 29 09/14/2014 1145   BUN 11 11/10/2014 1337   BUN 14 09/14/2014 1145   CREATININE 0.70 11/10/2014 1337   CREATININE 1.1 09/14/2014 1145      Component Value Date/Time   CALCIUM 8.6 11/10/2014 1337   CALCIUM 8.7 09/14/2014 1145   ALKPHOS 65 11/10/2014 1337   ALKPHOS 62 09/14/2014 1145   AST 24 11/10/2014 1337   AST 34 09/14/2014  1145   ALT 22 11/10/2014 1337   ALT 33 09/14/2014 1145   BILITOT 0.9 11/10/2014 1337   BILITOT 1.00 09/14/2014 1145     Impression and Plan: Andrew [name] is 81 year old white male with both myelodysplasia and hemochromatosis. He is doing well and is asymptomatic at this time.  He would like to space his phlebotomies out further apart so we are now doing them every 6 weeks.  His CBC looks good today. He will not need an Aranesp injection.  We will see him back in 6 weeks for labs and follow-up.  He will contact us with any questions or concerns. We can certainly see him sooner if need be.   Andrew Bottom, NP 7/21/201612:35 PM

## 2014-12-23 LAB — IRON AND TIBC CHCC
%SAT: >100 % (ref 20–?)
IRON: 160 ug/dL (ref 42–163)
TIBC: 151 ug/dL — AB (ref 202–409)

## 2014-12-23 LAB — FERRITIN CHCC: Ferritin: 2135 ng/ml — ABNORMAL HIGH (ref 22–316)

## 2015-02-02 ENCOUNTER — Ambulatory Visit (HOSPITAL_BASED_OUTPATIENT_CLINIC_OR_DEPARTMENT_OTHER): Payer: Medicare Other | Admitting: Family

## 2015-02-02 ENCOUNTER — Encounter: Payer: Self-pay | Admitting: Hematology & Oncology

## 2015-02-02 ENCOUNTER — Ambulatory Visit (HOSPITAL_BASED_OUTPATIENT_CLINIC_OR_DEPARTMENT_OTHER): Payer: Medicare Other

## 2015-02-02 ENCOUNTER — Other Ambulatory Visit (HOSPITAL_BASED_OUTPATIENT_CLINIC_OR_DEPARTMENT_OTHER): Payer: Medicare Other

## 2015-02-02 VITALS — BP 115/55 | HR 74 | Temp 97.2°F | Resp 16 | Ht 69.0 in | Wt 192.0 lb

## 2015-02-02 DIAGNOSIS — D46Z Other myelodysplastic syndromes: Secondary | ICD-10-CM

## 2015-02-02 DIAGNOSIS — D4620 Refractory anemia with excess of blasts, unspecified: Secondary | ICD-10-CM

## 2015-02-02 LAB — CBC WITH DIFFERENTIAL (CANCER CENTER ONLY)
HCT: 36.3 % — ABNORMAL LOW (ref 38.7–49.9)
HGB: 11.9 g/dL — ABNORMAL LOW (ref 13.0–17.1)
MCH: 37.3 pg — AB (ref 28.0–33.4)
MCHC: 32.8 g/dL (ref 32.0–35.9)
MCV: 114 fL — AB (ref 82–98)
PLATELETS: 104 10*3/uL — AB (ref 145–400)
RBC: 3.19 10*6/uL — ABNORMAL LOW (ref 4.20–5.70)
RDW: 16.9 % — ABNORMAL HIGH (ref 11.1–15.7)
WBC: 1.9 10*3/uL — ABNORMAL LOW (ref 4.0–10.0)

## 2015-02-02 LAB — MANUAL DIFFERENTIAL (CHCC SATELLITE)
ALC: 1 10*3/uL (ref 0.9–3.3)
ANC (CHCC MAN DIFF): 0.7 10*3/uL — AB (ref 1.5–6.5)
LYMPH: 55 % — ABNORMAL HIGH (ref 14–48)
MONO: 7 % (ref 0–13)
PLT EST ~~LOC~~: DECREASED
SEG: 38 % — ABNORMAL LOW (ref 40–75)

## 2015-02-02 LAB — RETICULOCYTES (CHCC)
ABS RETIC: 101.4 10*3/uL (ref 19.0–186.0)
RBC.: 3.27 MIL/uL — AB (ref 4.22–5.81)
Retic Ct Pct: 3.1 % — ABNORMAL HIGH (ref 0.4–2.3)

## 2015-02-02 LAB — FERRITIN CHCC: Ferritin: 2198 ng/ml — ABNORMAL HIGH (ref 22–316)

## 2015-02-02 LAB — IRON AND TIBC CHCC
%SAT: >100 % (ref 20–?)
Iron: 167 ug/dL — ABNORMAL HIGH (ref 42–163)
TIBC: 151 ug/dL — ABNORMAL LOW (ref 202–409)
UIBC: <1 ug/dL (ref 117–376)

## 2015-02-02 NOTE — Progress Notes (Signed)
Hematology and Oncology Follow Up Visit  Andrew [name] [id number] 1932-06-04 81 y.o. 02/02/2015   Principle Diagnosis:  Hemachromatosis (homozygous for C282Y mutation) Refractory anemia with multilineage dysplasia  Current Therapy:   Phlebotomy to get the ferritin down to less than 100 Aranesp 300 g subcutaneous for hemoglobin less than 11    Interim History: Andrew [name] is here with his wife for a follow-up. He is looking much better. He is in good spirits today and has no complaints.  He denies fever, chills, n/v, cough, rash, headache, dizziness, blurred vision, SOB, chest pain, palpitations, abdominal pain, changes in bowel or bladder habits. He has not noticed any blood in his urine or stool.  No lymphadenopathy found on assessment.  No swelling or tenderness in his extremities. He has chronic pain and neuropathy in his legs and feet. This is unchanged. He is doing well using a cane to ambulate and has not had any falls.  His appetite is good and he is making sure to stay well hydrated. He carries water with him. His weight is stable.   Medications:    Medication List       This list is accurate as of: 02/02/15  9:05 AM.  Always use your most recent med list.               doxazosin 4 MG tablet  Commonly known as:  CARDURA  Take 4 mg by mouth at bedtime.     finasteride 5 MG tablet  Commonly known as:  PROSCAR  Take 5 mg by mouth daily.     lactulose 10 GM/15ML solution  Commonly known as:  CHRONULAC  Take 15 mLs (10 g total) by mouth 3 (three) times daily. As needed.     lovastatin 20 MG tablet  Commonly known as:  MEVACOR  Take 20 mg by mouth at bedtime.     REFRESH OP  Place 1 drop into both eyes daily as needed (dry eyes).     SYNTHROID 137 MCG tablet  Generic drug:  levothyroxine  Take 137 mcg by mouth daily before breakfast.        Allergies: No Known Allergies  Past Medical History, Surgical history, Social history, and Family History were  reviewed and updated.  Review of Systems: All other 10 point review of systems is negative.   Physical Exam:  height is 5\' 9"  (1.753 m) and weight is 192 lb (87.091 kg). His oral temperature is 97.2 F (36.2 C). His blood pressure is 115/55 and his pulse is 74. His respiration is 16.   Wt Readings from Last 3 Encounters:  02/02/15 192 lb (87.091 kg)  12/22/14 194 lb (87.998 kg)  11/10/14 192 lb (87.091 kg)    Ocular: Sclerae unicteric, pupils equal, round and reactive to light Ear-nose-throat: Oropharynx clear, dentition fair Lymphatic: No cervical or supraclavicular adenopathy Lungs no rales or rhonchi, good excursion bilaterally Heart regular rate and rhythm, no murmur appreciated Abd soft, nontender, positive bowel sounds MSK no focal spinal tenderness, no joint edema Neuro: non-focal, well-oriented, appropriate affect  Lab Results  Component Value Date   WBC 1.9* 02/02/2015   HGB 11.9* 02/02/2015   HCT 36.3* 02/02/2015   MCV 114* 02/02/2015   PLT 104* 02/02/2015   Lab Results  Component Value Date   FERRITIN 2,135* 12/22/2014   IRON 160 12/22/2014   TIBC 151* 12/22/2014   UIBC <1.0 12/22/2014   IRONPCTSAT >100 12/22/2014   Lab Results  Component Value Date  RETICCTPCT 2.7* 12/22/2014   RBC 3.19* 02/02/2015   RETICCTABS 86.4 12/22/2014   No results found for: KPAFRELGTCHN, LAMBDASER, KAPLAMBRATIO No results found for: IGGSERUM, IGA, IGMSERUM No results found for: Odetta Pink, SPEI   Chemistry      Component Value Date/Time   NA 139 11/10/2014 1337   NA 144 09/14/2014 1145   K 3.9 11/10/2014 1337   K 4.2 09/14/2014 1145   CL 106 11/10/2014 1337   CL 105 09/14/2014 1145   CO2 27 11/10/2014 1337   CO2 29 09/14/2014 1145   BUN 11 11/10/2014 1337   BUN 14 09/14/2014 1145   CREATININE 0.70 11/10/2014 1337   CREATININE 1.1 09/14/2014 1145      Component Value Date/Time   CALCIUM 8.6 11/10/2014 1337     CALCIUM 8.7 09/14/2014 1145   ALKPHOS 65 11/10/2014 1337   ALKPHOS 62 09/14/2014 1145   AST 24 11/10/2014 1337   AST 34 09/14/2014 1145   ALT 22 11/10/2014 1337   ALT 33 09/14/2014 1145   BILITOT 0.9 11/10/2014 1337   BILITOT 1.00 09/14/2014 1145     Impression and Plan: Andrew [name] is 81 year old white male with both myelodysplasia and hemochromatosis. He is doing much better and is asymptomatic at this time.  His last set of iron studies were still quite high with iron saturation >100 and ferritin of 2,135. His iron studies today are pending.  We will go ahead and phlebotomize him today.  His CBC looks good today. He will not need an Aranesp injection.  Per his request we will plan to see him back in 8 weeks for labs follow-up and phlebotomy.  He will contact us with any questions or concerns. We can certainly see him sooner if need be.   Eliezer Bottom, NP 9/1/20169:05 AM

## 2015-02-02 NOTE — Patient Instructions (Signed)

## 2015-02-07 NOTE — Progress Notes (Signed)
Baron Sane presented on 02/02/15 for phlebotomy per MD orders. Phlebotomy procedure started at 0900 and ended at 0909 500 grams removed. Patient observed for 30 minutes after procedure without any incident. Patient tolerated procedure well. IV needle removed intact.

## 2015-02-16 ENCOUNTER — Other Ambulatory Visit: Payer: Self-pay | Admitting: Family

## 2015-04-06 ENCOUNTER — Telehealth: Payer: Self-pay | Admitting: *Deleted

## 2015-04-06 ENCOUNTER — Ambulatory Visit (HOSPITAL_COMMUNITY)
Admission: RE | Admit: 2015-04-06 | Discharge: 2015-04-06 | Disposition: A | Discharge status: Home / Self Care | Payer: Medicare Other | Source: Ambulatory Visit | Attending: Hematology & Oncology | Admitting: Hematology & Oncology

## 2015-04-06 ENCOUNTER — Encounter: Payer: Self-pay | Admitting: Hematology & Oncology

## 2015-04-06 ENCOUNTER — Ambulatory Visit (HOSPITAL_BASED_OUTPATIENT_CLINIC_OR_DEPARTMENT_OTHER): Payer: Medicare Other | Admitting: Hematology & Oncology

## 2015-04-06 ENCOUNTER — Other Ambulatory Visit (HOSPITAL_BASED_OUTPATIENT_CLINIC_OR_DEPARTMENT_OTHER): Payer: Medicare Other

## 2015-04-06 ENCOUNTER — Ambulatory Visit (HOSPITAL_BASED_OUTPATIENT_CLINIC_OR_DEPARTMENT_OTHER): Payer: Medicare Other

## 2015-04-06 VITALS — BP 120/68 | HR 79 | Temp 98.3°F | Resp 16

## 2015-04-06 VITALS — BP 95/46 | HR 84 | Temp 98.1°F | Resp 16

## 2015-04-06 DIAGNOSIS — R109 Unspecified abdominal pain: Secondary | ICD-10-CM | POA: Diagnosis not present

## 2015-04-06 DIAGNOSIS — D4620 Refractory anemia with excess of blasts, unspecified: Secondary | ICD-10-CM

## 2015-04-06 DIAGNOSIS — N390 Urinary tract infection, site not specified: Secondary | ICD-10-CM

## 2015-04-06 LAB — RETICULOCYTES (CHCC)
ABS RETIC: 34.9 10*3/uL (ref 19.0–186.0)
RBC.: 2.05 MIL/uL — ABNORMAL LOW (ref 4.22–5.81)
RETIC CT PCT: 1.7 % (ref 0.4–2.3)

## 2015-04-06 LAB — MANUAL DIFFERENTIAL (CHCC SATELLITE)
ALC: 0.8 10*3/uL — AB (ref 0.9–3.3)
ANC (CHCC HP manual diff): 1.1 10*3/uL — ABNORMAL LOW (ref 1.5–6.5)
LYMPH: 36 % (ref 14–48)
MONO: 6 % (ref 0–13)
MYELOCYTES: 1 % — AB (ref 0–0)
PLT EST ~~LOC~~: DECREASED
Platelet Morphology: NORMAL
SEG: 47 % (ref 40–75)

## 2015-04-06 LAB — IRON AND TIBC CHCC
IRON: 103 ug/dL (ref 42–163)
TIBC: 94 ug/dL — ABNORMAL LOW (ref 202–409)

## 2015-04-06 LAB — HOLD TUBE, BLOOD BANK - CHCC SATELLITE

## 2015-04-06 LAB — CMP (CANCER CENTER ONLY)
ALT(SGPT): 15 U/L (ref 10–47)
AST: 23 U/L (ref 11–38)
Albumin: 2.5 g/dL — ABNORMAL LOW (ref 3.3–5.5)
Alkaline Phosphatase: 46 U/L (ref 26–84)
BUN: 12 mg/dL (ref 7–22)
CHLORIDE: 100 meq/L (ref 98–108)
CO2: 25 mEq/L (ref 18–33)
Calcium: 8.5 mg/dL (ref 8.0–10.3)
Creat: 0.9 mg/dl (ref 0.6–1.2)
GLUCOSE: 116 mg/dL (ref 73–118)
POTASSIUM: 4.4 meq/L (ref 3.3–4.7)
Sodium: 139 mEq/L (ref 128–145)
Total Bilirubin: 0.8 mg/dl (ref 0.20–1.60)
Total Protein: 8.5 g/dL — ABNORMAL HIGH (ref 6.4–8.1)

## 2015-04-06 LAB — CBC WITH DIFFERENTIAL (CANCER CENTER ONLY)
HEMATOCRIT: 21.7 % — AB (ref 38.7–49.9)
HEMOGLOBIN: 6.8 g/dL — AB (ref 13.0–17.1)
MCH: 33.8 pg — AB (ref 28.0–33.4)
MCHC: 31.3 g/dL — ABNORMAL LOW (ref 32.0–35.9)
MCV: 108 fL — ABNORMAL HIGH (ref 82–98)
Platelets: 63 10*3/uL — ABNORMAL LOW (ref 145–400)
RBC: 2.01 10*6/uL — AB (ref 4.20–5.70)
RDW: 18.2 % — ABNORMAL HIGH (ref 11.1–15.7)
WBC: 2.3 10*3/uL — ABNORMAL LOW (ref 4.0–10.0)

## 2015-04-06 LAB — FERRITIN CHCC

## 2015-04-06 LAB — PREPARE RBC (CROSSMATCH)

## 2015-04-06 LAB — ABO/RH: ABO/RH(D): O POS

## 2015-04-06 MED ORDER — SODIUM CHLORIDE 0.9 % IV SOLN
250.0000 mL | Freq: Once | INTRAVENOUS | Status: AC
  Administered 2015-04-06: 250 mL via INTRAVENOUS

## 2015-04-06 NOTE — Telephone Encounter (Signed)
Critical Value HGB 6.8 Dr Ennever notified. No orders at this time 

## 2015-04-06 NOTE — Progress Notes (Signed)
Hematology and Oncology Follow Up Visit  Andrew [name] [id number] June 20, 1932 81 y.o. 04/06/2015   Principle Diagnosis:   Hemochromatosis (homozygous for C282Y mutation)  Refractory anemia with multilineage dysplasia) - and +8  Current Therapy:   Phlebotomy to get the ferritin down to less than 100 Aranesp 300 g subcutaneous for hemoglobin less than 11    Interim History:  [name] is back for followup. he is not doing well at all. He comes in a wheelchair. Apparently, he's been having problems for the past month.  He's had a urinary tract infection. His wife, he takes a lot of good notes, says that he has epididymitis. She wrote this down. He apparently was on Levaquin for this.  He is having lower abdominal pain. He's little constipated. He's having some leg pain.  He's had no bleeding.  He has no appetite. He's lost 17 pounds since we last saw him.  Again his appetite is not that good.  He's had no fever. He did have a temperature with this urinary tract infection but none since.  He's had no change in medications.  We have not given him Aranesp for quite a while. We typically have been phlebotomize him. He has hemochromatosis. His last iron studies show a ferritin of 2100. His iron saturation was 100%.  Overall, his performance status is ECOG 3.  Overall, his performance status is ECOG 2     Medications:  Current outpatient prescriptions:  .  doxazosin (CARDURA) 4 MG tablet, Take 4 mg by mouth at bedtime.  , Disp: , Rfl:  .  finasteride (PROSCAR) 5 MG tablet, Take 5 mg by mouth daily.  , Disp: , Rfl:  .  GENERLAC 10 GM/15ML SOLN, TAKE 15 ML BY MOUTH 3 TIMES A DAY AS NEEDED, Disp: 480 mL, Rfl: 3 .  KLOR-CON M20 20 MEQ tablet, Take 20 mEq by mouth daily., Disp: , Rfl: 3 .  lactulose (CHRONULAC) 10 GM/15ML solution, Take 15 mLs (10 g total) by mouth 3 (three) times daily. As needed., Disp: 480 mL, Rfl: 3 .  levothyroxine (SYNTHROID) 137 MCG tablet, Take 137 mcg  by mouth daily before breakfast., Disp: , Rfl:  .  lovastatin (MEVACOR) 20 MG tablet, Take 20 mg by mouth at bedtime. , Disp: , Rfl:  .  Polyvinyl Alcohol-Povidone (REFRESH OP), Place 1 drop into both eyes daily as needed (dry eyes)., Disp: , Rfl:   Allergies: No Known Allergies  Past Medical History, Surgical history, Social history, and Family History were reviewed and updated.  Review of Systems: As above  Physical Exam:  oral temperature is 98.1 F (36.7 C). His blood pressure is 95/46 and his pulse is 84. His respiration is 16.   Elderly white male who is somewhat thin. He looks like he is in chronic distress.ead and neck exam shows no ocular or oral lesions. There are no palpable cervical or supraclavicular lymph nodes. Lungs are clear. Cardiac exam regular rate and rhythm with no murmurs, rubs or bruits.. Abdomen is soft. He has good bowel sounds. There is no fluid wave. There is a well-healed laparotomy scar from his aneurysm repair. There is no palpable liver or spleen tip.  rectal exam shows some external hemorrhoids. No masses are noted in the rectal vault. Stool is heme-negative. Extremities shows no clubbing, cyanosis or edema. Skin exam no rashes. He has some scattered ecchymoses. Neurological exam shows some slight decrease in sensation in his legs.  Lab Results  Component Value Date  WBC 2.3* 04/06/2015   HGB 6.8* 04/06/2015   HCT 21.7* 04/06/2015   MCV 108* 04/06/2015   PLT 63* 04/06/2015     Chemistry      Component Value Date/Time   NA 139 11/10/2014 1337   NA 144 09/14/2014 1145   K 3.9 11/10/2014 1337   K 4.2 09/14/2014 1145   CL 106 11/10/2014 1337   CL 105 09/14/2014 1145   CO2 27 11/10/2014 1337   CO2 29 09/14/2014 1145   BUN 11 11/10/2014 1337   BUN 14 09/14/2014 1145   CREATININE 0.70 11/10/2014 1337   CREATININE 1.1 09/14/2014 1145      Component Value Date/Time   CALCIUM 8.6 11/10/2014 1337   CALCIUM 8.7 09/14/2014 1145   ALKPHOS 65 11/10/2014  1337   ALKPHOS 62 09/14/2014 1145   AST 24 11/10/2014 1337   AST 34 09/14/2014 1145   ALT 22 11/10/2014 1337   ALT 33 09/14/2014 1145   BILITOT 0.9 11/10/2014 1337   BILITOT 1.00 09/14/2014 1145         Impression and Plan: Andrew [name] is 82-year-old white male. He has both myelodysplasia and hemochromatosis. He has an incredible amount of iron. This is probably due to hemachromatosis in addition to ineffective erythropoiesis.  I am incredibly surprised by his low hemoglobin. He never has had a low hemoglobin.  I really have to worry about the possibility of him having some, transformation to acute leukemia. I looked at his blood smear. I do not see any blasts. He had couple teardrop cells. He had no nucleated red cells. His platelets were large.  I wonder if some of the anemia might be from his recent urinary tract infection and antibiotic use.  He clearly needs to be transfused. We will give him 1 unit of blood today and one unit of blood tomorrow.  I'll try to give him some Aranesp. His erythropoietin level is only 43. As such, Aranesp should help him.  Ultimately, we may have to do a bone marrow biopsy on him. Again, I worry about the possibility of transformation to acute leukemia.  I will plan to get him back in 2 weeks for follow-up.  I spent about 45 minutes with he and his wife today. This is much more complicated than normal and took quite a bit of time to try to sort out what is going on.     ENNEVER,PETER R, MD 11/3/201611:14 AM  

## 2015-04-06 NOTE — Patient Instructions (Signed)

## 2015-04-07 ENCOUNTER — Ambulatory Visit (HOSPITAL_BASED_OUTPATIENT_CLINIC_OR_DEPARTMENT_OTHER): Payer: Medicare Other

## 2015-04-07 ENCOUNTER — Other Ambulatory Visit: Payer: Self-pay

## 2015-04-07 VITALS — BP 113/61 | HR 84 | Temp 99.8°F | Resp 16

## 2015-04-07 DIAGNOSIS — T451X5A Adverse effect of antineoplastic and immunosuppressive drugs, initial encounter: Principal | ICD-10-CM

## 2015-04-07 DIAGNOSIS — D4620 Refractory anemia with excess of blasts, unspecified: Secondary | ICD-10-CM

## 2015-04-07 DIAGNOSIS — D6481 Anemia due to antineoplastic chemotherapy: Secondary | ICD-10-CM

## 2015-04-07 MED ORDER — SODIUM CHLORIDE 0.9 % IV SOLN
250.0000 mL | Freq: Once | INTRAVENOUS | Status: AC
  Administered 2015-04-07: 250 mL via INTRAVENOUS

## 2015-04-07 MED ORDER — DARBEPOETIN ALFA 300 MCG/0.6ML IJ SOSY
300.0000 ug | PREFILLED_SYRINGE | Freq: Once | INTRAMUSCULAR | Status: AC
  Administered 2015-04-07: 300 ug via SUBCUTANEOUS

## 2015-04-07 MED ORDER — DARBEPOETIN ALFA 300 MCG/0.6ML IJ SOSY
PREFILLED_SYRINGE | INTRAMUSCULAR | Status: AC
  Filled 2015-04-07: qty 0.6

## 2015-04-07 MED ORDER — INFLUENZA VAC SPLIT QUAD 0.5 ML IM SUSY
0.5000 mL | PREFILLED_SYRINGE | Freq: Once | INTRAMUSCULAR | Status: AC
Start: 2015-04-07 — End: ?
  Filled 2015-04-07: qty 0.5

## 2015-04-07 NOTE — Patient Instructions (Signed)
Blood Transfusion  A blood transfusion is a procedure in which you receive donated blood through an IV tube. You may need a blood transfusion because of illness, surgery, or injury. The blood may come from a donor, or it may be your own blood that you donated previously. The blood given in a transfusion is made up of different types of cells. You may receive:  Red blood cells. These carry oxygen and replace lost blood.  Platelets. These control bleeding.  Plasma. Thishelps blood to clot. If you have hemophilia or another clotting disorder, you may also receive other types of blood products. LET [facility] CARE PROVIDER KNOW ABOUT:  Any allergies you have.  All medicines you are taking, including vitamins, herbs, eye drops, creams, and over-the-counter medicines.  Previous problems you or members of your family have had with the use of anesthetics.  Any blood disorders you have.  Previous surgeries you have had.  Any medical conditions you may have.  Any previous reactions you have had during a blood transfusion.  RISKS AND COMPLICATIONS Generally, this is a safe procedure. However, problems may occur, including:  Having an allergic reaction to something in the donated blood.  Fever. This may be a reaction to the white blood cells in the transfused blood.  Iron overload. This can happen from having many transfusions.  Transfusion-related acute lung injury (TRALI). This is a rare reaction that causes lung damage. The cause is not known.TRALI can occur within hours of a transfusion or several days later.  Sudden (acute) or delayed hemolytic reactions. This happens if your blood does not match the cells in your transfusion. Your body's defense system (immune system) may try to attack the new cells. This complication is rare.  Infection. This is rare. BEFORE THE PROCEDURE  You may have a blood test to determine your blood type. This is necessary to know what kind of blood your  body will accept.  If you are going to have a planned surgery, you may donate your own blood. This may be done in case you need to have a transfusion.  If you have had an allergic reaction to a transfusion in the past, you may be given medicine to help prevent a reaction. Take this medicine only as directed by your health care provider.  You will have your temperature, blood pressure, and pulse monitored before the transfusion. PROCEDURE   An IV will be started in your hand or arm.  The bag of donated blood will be attached to your IV tube and given into your vein.  Your temperature, blood pressure, and pulse will be monitored regularly during the transfusion. This monitoring is done to detect early signs of a transfusion reaction.  If you have any signs or symptoms of a reaction, your transfusion will be stopped and you may be given medicine.  When the transfusion is over, your IV will be removed.  Pressure may be applied to the IV site for a few minutes.  A bandage (dressing) will be applied. The procedure may vary among health care providers and hospitals. AFTER THE PROCEDURE  Your blood pressure, temperature, and pulse will be monitored regularly.   This information is not intended to replace advice given to you by your health care provider. Make sure you discuss any questions you have with your health care provider.   Document Released: 05/17/2000 Document Revised: 06/10/2014 Document Reviewed: 03/30/2014 Elsevier Interactive Patient Education 2016 [employer]  Darbepoetin Alfa injection What is this medicine?  DARBEPOETIN ALFA (dar be POE e tin AL fa) helps your body make more red blood cells. It is used to treat anemia caused by chronic kidney failure and chemotherapy. This medicine may be used for other purposes; ask your health care provider or pharmacist if you have questions. What should I tell my health care provider before I take this medicine? They need to know if  you have any of these conditions: -blood clotting disorders or history of blood clots -cancer patient not on chemotherapy -cystic fibrosis -heart disease, such as angina, heart failure, or a history of a heart attack -hemoglobin level of 12 g/dL or greater -high blood pressure -low levels of folate, iron, or vitamin B12 -seizures -an unusual or allergic reaction to darbepoetin, erythropoietin, albumin, hamster proteins, latex, other medicines, foods, dyes, or preservatives -pregnant or trying to get pregnant -breast-feeding How should I use this medicine? This medicine is for injection into a vein or under the skin. It is usually given by a health care professional in a hospital or clinic setting. If you get this medicine at home, you will be taught how to prepare and give this medicine. Do not shake the solution before you withdraw a dose. Use exactly as directed. Take your medicine at regular intervals. Do not take your medicine more often than directed. It is important that you put your used needles and syringes in a special sharps container. Do not put them in a trash can. If you do not have a sharps container, call your pharmacist or healthcare provider to get one. Talk to your pediatrician regarding the use of this medicine in children. While this medicine may be used in children as young as 1 year for selected conditions, precautions do apply. Overdosage: If you think you have taken too much of this medicine contact a poison control center or emergency room at once. NOTE: This medicine is only for you. Do not share this medicine with others. What if I miss a dose? If you miss a dose, take it as soon as you can. If it is almost time for your next dose, take only that dose. Do not take double or extra doses. What may interact with this medicine? Do not take this medicine with any of the following medications: -epoetin alfa This list may not describe all possible interactions. Give your  health care provider a list of all the medicines, herbs, non-prescription drugs, or dietary supplements you use. Also tell them if you smoke, drink alcohol, or use illegal drugs. Some items may interact with your medicine. What should I watch for while using this medicine? Visit your prescriber or health care professional for regular checks on your progress and for the needed blood tests and blood pressure measurements. It is especially important for the doctor to make sure your hemoglobin level is in the desired range, to limit the risk of potential side effects and to give you the best benefit. Keep all appointments for any recommended tests. Check your blood pressure as directed. Ask your doctor what your blood pressure should be and when you should contact him or her. As your body makes more red blood cells, you may need to take iron, folic acid, or vitamin B supplements. Ask your doctor or health care provider which products are right for you. If you have kidney disease continue dietary restrictions, even though this medication can make you feel better. Talk with your doctor or health care professional about the foods you eat and the vitamins  that you take. What side effects may I notice from receiving this medicine? Side effects that you should report to your doctor or health care professional as soon as possible: -allergic reactions like skin rash, itching or hives, swelling of the face, lips, or tongue -breathing problems -changes in vision -chest pain -confusion, trouble speaking or understanding -feeling faint or lightheaded, falls -high blood pressure -muscle aches or pains -pain, swelling, warmth in the leg -rapid weight gain -severe headaches -sudden numbness or weakness of the face, arm or leg -trouble walking, dizziness, loss of balance or coordination -seizures (convulsions) -swelling of the ankles, feet, hands -unusually weak or tired Side effects that usually do not require  medical attention (report to your doctor or health care professional if they continue or are bothersome): -diarrhea -fever, chills (flu-like symptoms) -headaches -nausea, vomiting -redness, stinging, or swelling at site where injected This list may not describe all possible side effects. Call your doctor for medical advice about side effects. You may report side effects to FDA at 1-800-FDA-1088. Where should I keep my medicine? Keep out of the reach of children. Store in a refrigerator between 2 and 8 degrees C (36 and 46 degrees F). Do not freeze. Do not shake. Throw away any unused portion if using a single-dose vial. Throw away any unused medicine after the expiration date. NOTE: This sheet is a summary. It may not cover all possible information. If you have questions about this medicine, talk to your doctor, pharmacist, or health care provider.    2016, Elsevier/[employer] Standard. (2008-05-03 10:23:57)

## 2015-04-09 LAB — TYPE AND SCREEN
ABO/RH(D): O POS
ANTIBODY SCREEN: NEGATIVE
UNIT DIVISION: 0
UNIT DIVISION: 0

## 2015-04-10 LAB — ERYTHROPOIETIN: ERYTHROPOIETIN: 126.5 m[IU]/mL — AB (ref 2.6–18.5)

## 2015-04-19 ENCOUNTER — Encounter: Payer: Self-pay | Admitting: Hematology & Oncology

## 2015-04-20 ENCOUNTER — Other Ambulatory Visit: Payer: Self-pay | Admitting: Urology

## 2015-04-21 ENCOUNTER — Encounter: Payer: Self-pay | Admitting: Hematology & Oncology

## 2015-04-21 ENCOUNTER — Ambulatory Visit (HOSPITAL_BASED_OUTPATIENT_CLINIC_OR_DEPARTMENT_OTHER): Payer: Medicare Other

## 2015-04-21 ENCOUNTER — Other Ambulatory Visit (HOSPITAL_BASED_OUTPATIENT_CLINIC_OR_DEPARTMENT_OTHER): Payer: Medicare Other

## 2015-04-21 ENCOUNTER — Ambulatory Visit (HOSPITAL_BASED_OUTPATIENT_CLINIC_OR_DEPARTMENT_OTHER): Payer: Medicare Other | Admitting: Hematology & Oncology

## 2015-04-21 VITALS — BP 90/74 | HR 88 | Temp 97.6°F | Resp 18 | Ht 69.0 in | Wt 177.0 lb

## 2015-04-21 DIAGNOSIS — D4620 Refractory anemia with excess of blasts, unspecified: Secondary | ICD-10-CM

## 2015-04-21 DIAGNOSIS — Z23 Encounter for immunization: Secondary | ICD-10-CM | POA: Diagnosis not present

## 2015-04-21 DIAGNOSIS — D46Z Other myelodysplastic syndromes: Secondary | ICD-10-CM

## 2015-04-21 LAB — COMPREHENSIVE METABOLIC PANEL (CC13)
ALBUMIN: 2.3 g/dL — AB (ref 3.5–5.0)
ALT: 14 U/L (ref 0–55)
AST: 23 U/L (ref 5–34)
Alkaline Phosphatase: 66 U/L (ref 40–150)
Anion Gap: 5 mEq/L (ref 3–11)
BUN: 8.9 mg/dL (ref 7.0–26.0)
CALCIUM: 8.1 mg/dL — AB (ref 8.4–10.4)
CHLORIDE: 105 meq/L (ref 98–109)
CO2: 25 mEq/L (ref 22–29)
CREATININE: 0.8 mg/dL (ref 0.7–1.3)
EGFR: 82 mL/min/{1.73_m2} — ABNORMAL LOW (ref >90)
GLUCOSE: 121 mg/dL (ref 70–140)
POTASSIUM: 4.3 meq/L (ref 3.5–5.1)
SODIUM: 135 meq/L — AB (ref 136–145)
Total Bilirubin: 0.62 mg/dL (ref 0.20–1.20)
Total Protein: 7.6 g/dL (ref 6.4–8.3)

## 2015-04-21 LAB — MANUAL DIFFERENTIAL (CHCC SATELLITE)
ALC: 1.1 10*3/uL (ref 0.9–3.3)
ANC (CHCC MAN DIFF): 1.1 10*3/uL — AB (ref 1.5–6.5)
EOS: 1 % (ref 0–7)
LYMPH: 41 % (ref 14–48)
MONO: 16 % — ABNORMAL HIGH (ref 0–13)
PLT EST ~~LOC~~: DECREASED
SEG: 42 % (ref 40–75)

## 2015-04-21 LAB — CBC WITH DIFFERENTIAL (CANCER CENTER ONLY)
HEMATOCRIT: 26.9 % — AB (ref 38.7–49.9)
HEMOGLOBIN: 8.3 g/dL — AB (ref 13.0–17.1)
MCH: 31.2 pg (ref 28.0–33.4)
MCHC: 30.9 g/dL — AB (ref 32.0–35.9)
MCV: 101 fL — AB (ref 82–98)
Platelets: 142 10*3/uL — ABNORMAL LOW (ref 145–400)
RBC: 2.66 10*6/uL — ABNORMAL LOW (ref 4.20–5.70)
RDW: 23.1 % — AB (ref 11.1–15.7)
WBC: 2.7 10*3/uL — ABNORMAL LOW (ref 4.0–10.0)

## 2015-04-21 LAB — RETICULOCYTES (CHCC)
ABS Retic: 104.7 10*3/uL (ref 19.0–186.0)
RBC.: 2.83 MIL/uL — AB (ref 4.22–5.81)
RETIC CT PCT: 3.7 % — AB (ref 0.4–2.3)

## 2015-04-21 LAB — CHCC SATELLITE - SMEAR

## 2015-04-21 MED ORDER — DARBEPOETIN ALFA 300 MCG/0.6ML IJ SOSY
PREFILLED_SYRINGE | INTRAMUSCULAR | Status: AC
  Filled 2015-04-21: qty 0.6

## 2015-04-21 MED ORDER — INFLUENZA VAC SPLIT QUAD 0.5 ML IM SUSY
0.5000 mL | PREFILLED_SYRINGE | Freq: Once | INTRAMUSCULAR | Status: AC
  Administered 2015-04-21: 0.5 mL via INTRAMUSCULAR
  Filled 2015-04-21: qty 0.5

## 2015-04-21 MED ORDER — DARBEPOETIN ALFA 300 MCG/0.6ML IJ SOSY
300.0000 ug | PREFILLED_SYRINGE | Freq: Once | INTRAMUSCULAR | Status: AC
  Administered 2015-04-21: 300 ug via SUBCUTANEOUS

## 2015-04-21 NOTE — Patient Instructions (Signed)
Influenza Virus Vaccine injection What is this medicine? INFLUENZA VIRUS VACCINE (in floo EN zuh VAHY ruhs vak SEEN) helps to reduce the risk of getting influenza also known as the flu. The vaccine only helps protect you against some strains of the flu. This medicine may be used for other purposes; ask your health care provider or pharmacist if you have questions. What should I tell my health care provider before I take this medicine? They need to know if you have any of these conditions: -bleeding disorder like hemophilia -fever or infection -Guillain-Barre syndrome or other neurological problems -immune system problems -infection with the human immunodeficiency virus (HIV) or AIDS -low blood platelet counts -multiple sclerosis -an unusual or allergic reaction to influenza virus vaccine, latex, other medicines, foods, dyes, or preservatives. Different brands of vaccines contain different allergens. Some may contain latex or eggs. Talk to your doctor about your allergies to make sure that you get the right vaccine. -pregnant or trying to get pregnant -breast-feeding How should I use this medicine? This vaccine is for injection into a muscle or under the skin. It is given by a health care professional. A copy of Vaccine Information Statements will be given before each vaccination. Read this sheet carefully each time. The sheet may change frequently. Talk to your healthcare provider to see which vaccines are right for you. Some vaccines should not be used in all age groups. Overdosage: If you think you have taken too much of this medicine contact a poison control center or emergency room at once. NOTE: This medicine is only for you. Do not share this medicine with others. What if I miss a dose? This does not apply. What may interact with this medicine? -chemotherapy or radiation therapy -medicines that lower your immune system like etanercept, anakinra, infliximab, and adalimumab -medicines  that treat or prevent blood clots like warfarin -phenytoin -steroid medicines like prednisone or cortisone -theophylline -vaccines This list may not describe all possible interactions. Give your health care provider a list of all the medicines, herbs, non-prescription drugs, or dietary supplements you use. Also tell them if you smoke, drink alcohol, or use illegal drugs. Some items may interact with your medicine. What should I watch for while using this medicine? Report any side effects that do not go away within 3 days to your doctor or health care professional. Call your health care provider if any unusual symptoms occur within 6 weeks of receiving this vaccine. You may still catch the flu, but the illness is not usually as bad. You cannot get the flu from the vaccine. The vaccine will not protect against colds or other illnesses that may cause fever. The vaccine is needed every year. What side effects may I notice from receiving this medicine? Side effects that you should report to your doctor or health care professional as soon as possible: -allergic reactions like skin rash, itching or hives, swelling of the face, lips, or tongue Side effects that usually do not require medical attention (report to your doctor or health care professional if they continue or are bothersome): -fever -headache -muscle aches and pains -pain, tenderness, redness, or swelling at the injection site -tiredness This list may not describe all possible side effects. Call your doctor for medical advice about side effects. You may report side effects to FDA at 1-800-FDA-1088. Where should I keep my medicine? The vaccine will be given by a health care professional in a clinic, pharmacy, doctor's office, or other health care setting. You will not   be given vaccine doses to store at home. NOTE: This sheet is a summary. It may not cover all possible information. If you have questions about this medicine, talk to your  doctor, pharmacist, or health care provider.    2016, Elsevier/[employer] Standard. (2014-12-09 10:07:28)   Darbepoetin Alfa injection What is this medicine? DARBEPOETIN ALFA (dar be POE e tin AL fa) helps your body make more red blood cells. It is used to treat anemia caused by chronic kidney failure and chemotherapy. This medicine may be used for other purposes; ask your health care provider or pharmacist if you have questions. What should I tell my health care provider before I take this medicine? They need to know if you have any of these conditions: -blood clotting disorders or history of blood clots -cancer patient not on chemotherapy -cystic fibrosis -heart disease, such as angina, heart failure, or a history of a heart attack -hemoglobin level of 12 g/dL or greater -high blood pressure -low levels of folate, iron, or vitamin B12 -seizures -an unusual or allergic reaction to darbepoetin, erythropoietin, albumin, hamster proteins, latex, other medicines, foods, dyes, or preservatives -pregnant or trying to get pregnant -breast-feeding How should I use this medicine? This medicine is for injection into a vein or under the skin. It is usually given by a health care professional in a hospital or clinic setting. If you get this medicine at home, you will be taught how to prepare and give this medicine. Do not shake the solution before you withdraw a dose. Use exactly as directed. Take your medicine at regular intervals. Do not take your medicine more often than directed. It is important that you put your used needles and syringes in a special sharps container. Do not put them in a trash can. If you do not have a sharps container, call your pharmacist or healthcare provider to get one. Talk to your pediatrician regarding the use of this medicine in children. While this medicine may be used in children as young as 1 year for selected conditions, precautions do apply. Overdosage: If you think  you have taken too much of this medicine contact a poison control center or emergency room at once. NOTE: This medicine is only for you. Do not share this medicine with others. What if I miss a dose? If you miss a dose, take it as soon as you can. If it is almost time for your next dose, take only that dose. Do not take double or extra doses. What may interact with this medicine? Do not take this medicine with any of the following medications: -epoetin alfa This list may not describe all possible interactions. Give your health care provider a list of all the medicines, herbs, non-prescription drugs, or dietary supplements you use. Also tell them if you smoke, drink alcohol, or use illegal drugs. Some items may interact with your medicine. What should I watch for while using this medicine? Visit your prescriber or health care professional for regular checks on your progress and for the needed blood tests and blood pressure measurements. It is especially important for the doctor to make sure your hemoglobin level is in the desired range, to limit the risk of potential side effects and to give you the best benefit. Keep all appointments for any recommended tests. Check your blood pressure as directed. Ask your doctor what your blood pressure should be and when you should contact him or her. As your body makes more red blood cells, you may need to  take iron, folic acid, or vitamin B supplements. Ask your doctor or health care provider which products are right for you. If you have kidney disease continue dietary restrictions, even though this medication can make you feel better. Talk with your doctor or health care professional about the foods you eat and the vitamins that you take. What side effects may I notice from receiving this medicine? Side effects that you should report to your doctor or health care professional as soon as possible: -allergic reactions like skin rash, itching or hives, swelling of  the face, lips, or tongue -breathing problems -changes in vision -chest pain -confusion, trouble speaking or understanding -feeling faint or lightheaded, falls -high blood pressure -muscle aches or pains -pain, swelling, warmth in the leg -rapid weight gain -severe headaches -sudden numbness or weakness of the face, arm or leg -trouble walking, dizziness, loss of balance or coordination -seizures (convulsions) -swelling of the ankles, feet, hands -unusually weak or tired Side effects that usually do not require medical attention (report to your doctor or health care professional if they continue or are bothersome): -diarrhea -fever, chills (flu-like symptoms) -headaches -nausea, vomiting -redness, stinging, or swelling at site where injected This list may not describe all possible side effects. Call your doctor for medical advice about side effects. You may report side effects to FDA at 1-800-FDA-1088. Where should I keep my medicine? Keep out of the reach of children. Store in a refrigerator between 2 and 8 degrees C (36 and 46 degrees F). Do not freeze. Do not shake. Throw away any unused portion if using a single-dose vial. Throw away any unused medicine after the expiration date. NOTE: This sheet is a summary. It may not cover all possible information. If you have questions about this medicine, talk to your doctor, pharmacist, or health care provider.    2016, [employer]. (2008-05-03 10:23:57)

## 2015-04-24 NOTE — Progress Notes (Signed)
Hematology and Oncology Follow Up Visit  Andrew [name] [id number] 03-04-1933 81 y.o. 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 is back for followup. He is feeling a bit better.  He was quite anemic last week. We went ahead and gave him 2 units of blood.  He also got Aranesp.  His quality of life clearly as dictated by the myelodysplasia. As such, this is what I think we need to focus on. I does don't see that phlebotomizing him is going to improve his quality of life.  I worry about the possibility of transformation into acute leukemia with his myelodysplasia. I'm not yet done a bone marrow test on him but this might be some that we have to consider.  His appetite might be a little bit better.  His last iron studies done back in early November showed a ferritin of 1642. His total iron was 103. His iron saturation was greater than 100%.  Overall, his performance status is ECOG 2     Medications:  Current outpatient prescriptions:  .  doxazosin (CARDURA) 4 MG tablet, Take 4 mg by mouth at bedtime.  , Disp: , Rfl:  .  finasteride (PROSCAR) 5 MG tablet, Take 5 mg by mouth daily.  , Disp: , Rfl:  .  GENERLAC 10 GM/15ML SOLN, TAKE 15 ML BY MOUTH 3 TIMES A DAY AS NEEDED, Disp: 480 mL, Rfl: 3 .  KLOR-CON M20 20 MEQ tablet, Take 20 mEq by mouth daily., Disp: , Rfl: 3 .  lactulose (CHRONULAC) 10 GM/15ML solution, Take 15 mLs (10 g total) by mouth 3 (three) times daily. As needed., Disp: 480 mL, Rfl: 3 .  levothyroxine (SYNTHROID) 137 MCG tablet, Take 137 mcg by mouth daily before breakfast., Disp: , Rfl:  .  lovastatin (MEVACOR) 20 MG tablet, Take 20 mg by mouth at bedtime. , Disp: , Rfl:  .  Polyvinyl Alcohol-Povidone  (REFRESH OP), Place 1 drop into both eyes daily as needed (dry eyes)., Disp: , Rfl:  No current facility-administered medications for this visit.  Facility-Administered Medications Ordered in Other Visits:  .  Influenza vac split quadrivalent PF (FLUARIX) injection 0.5 mL, 0.5 mL, Intramuscular, Once, Volanda Napoleon, MD, 0.5 mL at 04/07/15 1604  Allergies: No Known Allergies  Past Medical History, Surgical history, Social history, and Family History were reviewed and updated.  Review of Systems: As above  Physical Exam:  height is 5\' 9"  (1.753 m) and weight is 177 lb (80.287 kg). His oral temperature is 97.6 F (36.4 C). His blood pressure is 90/74 and his pulse is 88. His respiration is 18.   Elderly white male who is somewhat thin. He looks like he is in chronic distress.ead and neck exam shows no ocular or oral lesions. There are no palpable cervical or supraclavicular lymph nodes. Lungs are clear. Cardiac exam regular rate and rhythm with no murmurs, rubs or bruits.. Abdomen is soft. He has good bowel sounds. There is no fluid wave. There is a well-healed laparotomy scar from his aneurysm repair. There is no palpable liver or spleen tip.  rectal exam shows some external hemorrhoids. No masses are noted in the rectal vault. Stool is heme-negative. Extremities shows no clubbing, cyanosis or edema.  Skin exam no rashes. He has some scattered ecchymoses. Neurological exam shows some slight decrease in sensation in his legs.  Lab Results  Component Value Date   WBC 2.7* 04/21/2015   HGB 8.3* 04/21/2015   HCT 26.9* 04/21/2015   MCV 101* 04/21/2015   PLT 142* 04/21/2015     Chemistry      Component Value Date/Time   NA 135* 04/21/2015 1416   NA 139 04/06/2015 1056   NA 139 11/10/2014 1337   K 4.3 04/21/2015 1416   K 4.4 04/06/2015 1056   K 3.9 11/10/2014 1337   CL 100 04/06/2015 1056   CL 106 11/10/2014 1337   CO2 25 04/21/2015 1416   CO2 25 04/06/2015 1056   CO2 27 11/10/2014  1337   BUN 8.9 04/21/2015 1416   BUN 12 04/06/2015 1056   BUN 11 11/10/2014 1337   CREATININE 0.8 04/21/2015 1416   CREATININE 0.9 04/06/2015 1056   CREATININE 0.70 11/10/2014 1337      Component Value Date/Time   CALCIUM 8.1* 04/21/2015 1416   CALCIUM 8.5 04/06/2015 1056   CALCIUM 8.6 11/10/2014 1337   ALKPHOS 66 04/21/2015 1416   ALKPHOS 46 04/06/2015 1056   ALKPHOS 65 11/10/2014 1337   AST 23 04/21/2015 1416   AST 23 04/06/2015 1056   AST 24 11/10/2014 1337   ALT 14 04/21/2015 1416   ALT 15 04/06/2015 1056   ALT 22 11/10/2014 1337   BILITOT 0.62 04/21/2015 1416   BILITOT 0.80 04/06/2015 1056   BILITOT 0.9 11/10/2014 1337         Impression and Plan: Andrew [name] is 81 year old white male. He has both myelodysplasia and hemochromatosis. He has an incredible amount of iron. This is probably due to hemachromatosis in addition to ineffective erythropoiesis.  since he is feeling a little bit better, I think that we can just follow him along for right now.  We will go ahead and give him an Aranesp injection..  I will plan to get him back in 2 weeks for follow-up.  I spent about 35 minutes with he and his wife today.   Volanda Napoleon, MD 11/21/20167:58 AM

## 2015-05-05 ENCOUNTER — Encounter: Payer: Self-pay | Admitting: Family

## 2015-05-05 ENCOUNTER — Ambulatory Visit (HOSPITAL_BASED_OUTPATIENT_CLINIC_OR_DEPARTMENT_OTHER): Payer: Medicare Other | Admitting: Family

## 2015-05-05 ENCOUNTER — Ambulatory Visit (HOSPITAL_BASED_OUTPATIENT_CLINIC_OR_DEPARTMENT_OTHER): Payer: Medicare Other

## 2015-05-05 ENCOUNTER — Other Ambulatory Visit (HOSPITAL_BASED_OUTPATIENT_CLINIC_OR_DEPARTMENT_OTHER): Payer: Medicare Other

## 2015-05-05 ENCOUNTER — Encounter (HOSPITAL_BASED_OUTPATIENT_CLINIC_OR_DEPARTMENT_OTHER): Payer: Self-pay | Admitting: *Deleted

## 2015-05-05 VITALS — BP 99/54 | HR 79 | Temp 98.1°F | Resp 16 | Ht 69.0 in | Wt 175.0 lb

## 2015-05-05 DIAGNOSIS — D464 Refractory anemia, unspecified: Secondary | ICD-10-CM

## 2015-05-05 DIAGNOSIS — D4620 Refractory anemia with excess of blasts, unspecified: Secondary | ICD-10-CM

## 2015-05-05 LAB — MANUAL DIFFERENTIAL (CHCC SATELLITE)
ALC: 1.5 10*3/uL (ref 0.9–3.3)
ANC (CHCC MAN DIFF): 2.9 10*3/uL (ref 1.5–6.5)
BAND NEUTROPHILS: 1 % (ref 0–10)
LYMPH: 30 % (ref 14–48)
MONO: 11 % (ref 0–13)
Myelocytes: 1 % — ABNORMAL HIGH (ref 0–0)
PLT EST ~~LOC~~: ADEQUATE
SEG: 57 % (ref 40–75)

## 2015-05-05 LAB — CBC WITH DIFFERENTIAL (CANCER CENTER ONLY)
HCT: 29.2 % — ABNORMAL LOW (ref 38.7–49.9)
HGB: 8.9 g/dL — ABNORMAL LOW (ref 13.0–17.1)
MCH: 30.8 pg (ref 28.0–33.4)
MCHC: 30.5 g/dL — AB (ref 32.0–35.9)
MCV: 101 fL — ABNORMAL HIGH (ref 82–98)
PLATELETS: 178 10*3/uL (ref 145–400)
RBC: 2.89 10*6/uL — AB (ref 4.20–5.70)
RDW: 24.5 % — ABNORMAL HIGH (ref 11.1–15.7)
WBC: 4.9 10*3/uL (ref 4.0–10.0)

## 2015-05-05 MED ORDER — DARBEPOETIN ALFA 300 MCG/0.6ML IJ SOSY
300.0000 ug | PREFILLED_SYRINGE | Freq: Once | INTRAMUSCULAR | Status: AC
  Administered 2015-05-05: 300 ug via SUBCUTANEOUS

## 2015-05-05 MED ORDER — DARBEPOETIN ALFA 300 MCG/0.6ML IJ SOSY
PREFILLED_SYRINGE | INTRAMUSCULAR | Status: AC
  Filled 2015-05-05: qty 0.6

## 2015-05-05 NOTE — Progress Notes (Signed)
NPO AFTER MN.  ARRIVE AT 1030.  CURRENT LAB RESULTS AND EKG IN CHART AND EPIC.  WILL TAKE FINASTERIDE AND SYNTHROID AM DOS W/ SIPS OF WATER.

## 2015-05-05 NOTE — Progress Notes (Signed)
Hematology and Oncology Follow Up Visit  Andrew [name] [id number] 02-Feb-1933 81 y.o. 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 He is doing fairly well. He has had some difficulty urinating and has a cystoscopy scheduled for next week.  His Hgb is stable at 8.9 and an MCV of 101. He has had no episodes of bleeding or excessive bruising.  No lymphadenopathy found on assessment.  No fever, chills, n/v, cough, rash, headache, dizziness, blurred vision, SOB, chest pain, palpitations, abdominal pain or changes in bowel habits.  No swelling or tenderness in his extremities. 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 He uses a cane to ambulate at home and has had no recent falls. He is in a wheelchair today.  He is eating and staying well hydrated. His weight is down 2 lbs this visit. His goal is to gain 5 lbs and weight 180 again.   Medications:    Medication List       This list is accurate as of: 05/05/15  5:11 PM.  Always use your most recent med list.               cephALEXin 500 MG capsule  Commonly known as:  KEFLEX  TAKE 1 CAPSULE 3 TIMES DAILY.     doxazosin 4 MG tablet  Commonly known as:  CARDURA  Take 4 mg by mouth at bedtime.     finasteride 5 MG tablet  Commonly known as:  PROSCAR  Take 5 mg by mouth every morning.     GENERLAC 10 GM/15ML Soln  Generic drug:  lactulose (encephalopathy)  TAKE 15 ML BY MOUTH 3 TIMES A DAY AS NEEDED     KLOR-CON M20 20 MEQ tablet  Generic drug:  potassium chloride SA  Take 20 mEq by mouth daily.     lactulose 10 GM/15ML solution  Commonly known as:  CHRONULAC  Take 15 mLs (10 g total) by mouth 3  (three) times daily. As needed.     lovastatin 20 MG tablet  Commonly known as:  MEVACOR  Take 20 mg by mouth at bedtime.     REFRESH OP  Place 1 drop into both eyes daily as needed (dry eyes).     SYNTHROID 137 MCG tablet  Generic drug:  levothyroxine  Take 137 mcg by mouth daily before breakfast.        Allergies: No Known Allergies  Past Medical History, Surgical history, Social history, and Family History were reviewed and updated.  Review of Systems: All other 10 point review of systems is negative.   Physical Exam:  height is 5\' 9"  (1.753 m) and weight is 175 lb (79.379 kg). His oral temperature is 98.1 F (36.7 C). His blood pressure is 99/54 and his pulse is 79. His respiration is 16.   Wt Readings from Last 3 Encounters:  05/05/15 175 lb (79.379 kg)  04/21/15 177 lb (80.287 kg)  02/02/15 192 lb (87.091 kg)    Ocular: Sclerae unicteric, pupils equal, round and reactive to light Ear-nose-throat: Oropharynx clear, dentition fair Lymphatic: No cervical supraclavicular or axillary adenopathy Lungs no rales or rhonchi, good excursion bilaterally Heart regular rate and rhythm,  no murmur appreciated Abd soft, nontender, positive bowel sounds MSK no focal spinal tenderness, no joint edema Neuro: non-focal, well-oriented, appropriate affect  Lab Results  Component Value Date   WBC 4.9 05/05/2015   HGB 8.9* 05/05/2015   HCT 29.2* 05/05/2015   MCV 101* 05/05/2015   PLT 178 05/05/2015   Lab Results  Component Value Date   FERRITIN 1,642 Result Confirmed by Automated Dilution.* 04/06/2015   IRON 103 04/06/2015   TIBC 94* 04/06/2015   UIBC <1.0 04/06/2015   IRONPCTSAT >100 04/06/2015   Lab Results  Component Value Date   RETICCTPCT 3.7* 04/21/2015   RBC 2.89* 05/05/2015   RETICCTABS 104.7 04/21/2015   No results found for: KPAFRELGTCHN, LAMBDASER, KAPLAMBRATIO No results found for: IGGSERUM, IGA, IGMSERUM No results found for: Kathrynn Ducking, MSPIKE, SPEI   Chemistry      Component Value Date/Time   NA 135* 04/21/2015 1416   NA 139 04/06/2015 1056   NA 139 11/10/2014 1337   K 4.3 04/21/2015 1416   K 4.4 04/06/2015 1056   K 3.9 11/10/2014 1337   CL 100 04/06/2015 1056   CL 106 11/10/2014 1337   CO2 25 04/21/2015 1416   CO2 25 04/06/2015 1056   CO2 27 11/10/2014 1337   BUN 8.9 04/21/2015 1416   BUN 12 04/06/2015 1056   BUN 11 11/10/2014 1337   CREATININE 0.8 04/21/2015 1416   CREATININE 0.9 04/06/2015 1056   CREATININE 0.70 11/10/2014 1337      Component Value Date/Time   CALCIUM 8.1* 04/21/2015 1416   CALCIUM 8.5 04/06/2015 1056   CALCIUM 8.6 11/10/2014 1337   ALKPHOS 66 04/21/2015 1416   ALKPHOS 46 04/06/2015 1056   ALKPHOS 65 11/10/2014 1337   AST 23 04/21/2015 1416   AST 23 04/06/2015 1056   AST 24 11/10/2014 1337   ALT 14 04/21/2015 1416   ALT 15 04/06/2015 1056   ALT 22 11/10/2014 1337   BILITOT 0.62 04/21/2015 1416   BILITOT 0.80 04/06/2015 1056   BILITOT 0.9 11/10/2014 1337     Impression and Plan: Andrew [name] is 81 year old white male with both myelodysplasia and hemochromatosis. He seems to be feeling better and has no complaints at this time.  We are holding off on phlebotomizing him at this time due to his bouts with anemia.  We will give him a dose of Aranesp today. His Hgb is holding nicely at 8.9.  We will plan to see him back in 3 weeks for labs and follow-up.  He will contact us with any questions or concerns. We can certainly see him sooner if need be.   Eliezer Bottom, NP 12/2/20165:11 PM

## 2015-05-05 NOTE — Patient Instructions (Signed)
Darbepoetin Alfa injection What is this medicine? DARBEPOETIN ALFA (dar be POE e tin AL fa) helps your body make more red blood cells. It is used to treat anemia caused by chronic kidney failure and chemotherapy. This medicine may be used for other purposes; ask your health care provider or pharmacist if you have questions. What should I tell my health care provider before I take this medicine? They need to know if you have any of these conditions: -blood clotting disorders or history of blood clots -cancer patient not on chemotherapy -cystic fibrosis -heart disease, such as angina, heart failure, or a history of a heart attack -hemoglobin level of 12 g/dL or greater -high blood pressure -low levels of folate, iron, or vitamin B12 -seizures -an unusual or allergic reaction to darbepoetin, erythropoietin, albumin, hamster proteins, latex, other medicines, foods, dyes, or preservatives -pregnant or trying to get pregnant -breast-feeding How should I use this medicine? This medicine is for injection into a vein or under the skin. It is usually given by a health care professional in a hospital or clinic setting. If you get this medicine at home, you will be taught how to prepare and give this medicine. Do not shake the solution before you withdraw a dose. Use exactly as directed. Take your medicine at regular intervals. Do not take your medicine more often than directed. It is important that you put your used needles and syringes in a special sharps container. Do not put them in a trash can. If you do not have a sharps container, call your pharmacist or healthcare provider to get one. Talk to your pediatrician regarding the use of this medicine in children. While this medicine may be used in children as young as 1 year for selected conditions, precautions do apply. Overdosage: If you think you have taken too much of this medicine contact a poison control center or emergency room at once. NOTE:  This medicine is only for you. Do not share this medicine with others. What if I miss a dose? If you miss a dose, take it as soon as you can. If it is almost time for your next dose, take only that dose. Do not take double or extra doses. What may interact with this medicine? Do not take this medicine with any of the following medications: -epoetin alfa This list may not describe all possible interactions. Give your health care provider a list of all the medicines, herbs, non-prescription drugs, or dietary supplements you use. Also tell them if you smoke, drink alcohol, or use illegal drugs. Some items may interact with your medicine. What should I watch for while using this medicine? Visit your prescriber or health care professional for regular checks on your progress and for the needed blood tests and blood pressure measurements. It is especially important for the doctor to make sure your hemoglobin level is in the desired range, to limit the risk of potential side effects and to give you the best benefit. Keep all appointments for any recommended tests. Check your blood pressure as directed. Ask your doctor what your blood pressure should be and when you should contact him or her. As your body makes more red blood cells, you may need to take iron, folic acid, or vitamin B supplements. Ask your doctor or health care provider which products are right for you. If you have kidney disease continue dietary restrictions, even though this medication can make you feel better. Talk with your doctor or health care professional about the   foods you eat and the vitamins that you take. What side effects may I notice from receiving this medicine? Side effects that you should report to your doctor or health care professional as soon as possible: -allergic reactions like skin rash, itching or hives, swelling of the face, lips, or tongue -breathing problems -changes in vision -chest pain -confusion, trouble speaking  or understanding -feeling faint or lightheaded, falls -high blood pressure -muscle aches or pains -pain, swelling, warmth in the leg -rapid weight gain -severe headaches -sudden numbness or weakness of the face, arm or leg -trouble walking, dizziness, loss of balance or coordination -seizures (convulsions) -swelling of the ankles, feet, hands -unusually weak or tired Side effects that usually do not require medical attention (report to your doctor or health care professional if they continue or are bothersome): -diarrhea -fever, chills (flu-like symptoms) -headaches -nausea, vomiting -redness, stinging, or swelling at site where injected This list may not describe all possible side effects. Call your doctor for medical advice about side effects. You may report side effects to FDA at 1-800-FDA-1088. Where should I keep my medicine? Keep out of the reach of children. Store in a refrigerator between 2 and 8 degrees C (36 and 46 degrees F). Do not freeze. Do not shake. Throw away any unused portion if using a single-dose vial. Throw away any unused medicine after the expiration date. NOTE: This sheet is a summary. It may not cover all possible information. If you have questions about this medicine, talk to your doctor, pharmacist, or health care provider.    2016, [employer]. (2008-05-03 10:23:57)  

## 2015-05-06 LAB — COMPREHENSIVE METABOLIC PANEL (CC13)
ALK PHOS: 68 U/L (ref 40–115)
ALT: 11 U/L (ref 9–46)
AST: 16 U/L (ref 10–35)
Albumin: 2.6 g/dL — ABNORMAL LOW (ref 3.6–5.1)
BILIRUBIN TOTAL: 0.5 mg/dL (ref 0.2–1.2)
BUN: 12 mg/dL (ref 7–25)
CO2: 25 mmol/L (ref 20–31)
Calcium: 7.7 mg/dL — ABNORMAL LOW (ref 8.6–10.3)
Chloride: 103 mmol/L (ref 98–110)
Creatinine, Ser: 0.85 mg/dL (ref 0.70–1.11)
GLUCOSE: 92 mg/dL (ref 65–99)
Potassium: 3.8 mmol/L (ref 3.5–5.3)
Sodium: 133 mmol/L — ABNORMAL LOW (ref 135–146)
TOTAL PROTEIN: 7.5 g/dL (ref 6.1–8.1)

## 2015-05-06 LAB — RETICULOCYTES
ABS Retic: 98.9 10*3/uL (ref 19.0–186.0)
RBC.: 2.91 MIL/uL — ABNORMAL LOW (ref 4.22–5.81)
Retic Ct Pct: 3.4 % — ABNORMAL HIGH (ref 0.4–2.3)

## 2015-05-06 LAB — TESTOSTERONE: Testosterone: 185 ng/dL — ABNORMAL LOW (ref 300–890)

## 2015-05-08 LAB — FERRITIN

## 2015-05-08 LAB — IRON AND TIBC
%SAT: >100 % (ref 20–?)
IRON: 110 ug/dL (ref 42–163)
TIBC: 98 ug/dL — AB (ref 202–409)
UIBC: <1 ug/dL (ref 117–376)

## 2015-05-11 ENCOUNTER — Encounter (HOSPITAL_BASED_OUTPATIENT_CLINIC_OR_DEPARTMENT_OTHER): Payer: Self-pay

## 2015-05-11 ENCOUNTER — Ambulatory Visit (HOSPITAL_BASED_OUTPATIENT_CLINIC_OR_DEPARTMENT_OTHER): Payer: Medicare Other | Admitting: Anesthesiology

## 2015-05-11 ENCOUNTER — Ambulatory Visit (HOSPITAL_BASED_OUTPATIENT_CLINIC_OR_DEPARTMENT_OTHER)
Admission: RE | Admit: 2015-05-11 | Discharge: 2015-05-11 | Disposition: A | Discharge status: Home / Self Care | Payer: Medicare Other | Source: Ambulatory Visit | Attending: Urology | Admitting: Urology

## 2015-05-11 ENCOUNTER — Encounter (HOSPITAL_BASED_OUTPATIENT_CLINIC_OR_DEPARTMENT_OTHER)
Admission: RE | Disposition: A | Discharge status: Home / Self Care | Payer: Self-pay | Source: Ambulatory Visit | Attending: Urology

## 2015-05-11 DIAGNOSIS — N359 Urethral stricture, unspecified: Secondary | ICD-10-CM | POA: Insufficient documentation

## 2015-05-11 DIAGNOSIS — N3289 Other specified disorders of bladder: Secondary | ICD-10-CM | POA: Diagnosis not present

## 2015-05-11 DIAGNOSIS — E039 Hypothyroidism, unspecified: Secondary | ICD-10-CM | POA: Insufficient documentation

## 2015-05-11 DIAGNOSIS — Z87891 Personal history of nicotine dependence: Secondary | ICD-10-CM | POA: Diagnosis not present

## 2015-05-11 DIAGNOSIS — N3021 Other chronic cystitis with hematuria: Secondary | ICD-10-CM | POA: Diagnosis not present

## 2015-05-11 DIAGNOSIS — D649 Anemia, unspecified: Secondary | ICD-10-CM | POA: Insufficient documentation

## 2015-05-11 DIAGNOSIS — Z79899 Other long term (current) drug therapy: Secondary | ICD-10-CM | POA: Insufficient documentation

## 2015-05-11 DIAGNOSIS — D4620 Refractory anemia with excess of blasts, unspecified: Secondary | ICD-10-CM | POA: Diagnosis not present

## 2015-05-11 DIAGNOSIS — N312 Flaccid neuropathic bladder, not elsewhere classified: Secondary | ICD-10-CM | POA: Diagnosis not present

## 2015-05-11 DIAGNOSIS — I739 Peripheral vascular disease, unspecified: Secondary | ICD-10-CM | POA: Diagnosis not present

## 2015-05-11 HISTORY — PX: CYSTOSCOPY WITH RETROGRADE URETHROGRAM: SHX6309

## 2015-05-11 HISTORY — DX: Hyperlipidemia, unspecified: E78.5

## 2015-05-11 HISTORY — DX: Presence of external hearing-aid: Z97.4

## 2015-05-11 HISTORY — DX: Personal history of other diseases of the circulatory system: Z86.79

## 2015-05-11 HISTORY — DX: Aneurysm of iliac artery: I72.3

## 2015-05-11 HISTORY — PX: BALLOON DILATION: SHX5330

## 2015-05-11 HISTORY — DX: Personal history of other diseases of the circulatory system: Z98.890

## 2015-05-11 HISTORY — DX: Peripheral vascular disease, unspecified: I73.9

## 2015-05-11 HISTORY — DX: Presence of dental prosthetic device (complete) (partial): Z97.2

## 2015-05-11 HISTORY — DX: Presence of spectacles and contact lenses: Z97.3

## 2015-05-11 HISTORY — DX: Refractory cytopenia with multilineage dysplasia: D46.A

## 2015-05-11 HISTORY — DX: Unspecified urethral stricture, male, unspecified site: N35.919

## 2015-05-11 HISTORY — DX: Other specified health status: Z78.9

## 2015-05-11 SURGERY — CYSTOSCOPY WITH RETROGRADE URETHROGRAM
Anesthesia: General | Site: Renal

## 2015-05-11 MED ORDER — CEPHALEXIN 500 MG PO CAPS: 500.0000 mg | ORAL_CAPSULE | Freq: Three times a day (TID) | ORAL | Status: DC

## 2015-05-11 MED ORDER — CEFAZOLIN SODIUM-DEXTROSE 2-3 GM-% IV SOLR
INTRAVENOUS | Status: AC
  Filled 2015-05-11: qty 50

## 2015-05-11 MED ORDER — IOHEXOL 350 MG/ML SOLN
INTRAVENOUS | Status: DC | PRN
  Administered 2015-05-11: 10 mL

## 2015-05-11 MED ORDER — CIPROFLOXACIN IN D5W 400 MG/200ML IV SOLN
400.0000 mg | INTRAVENOUS | Status: AC
  Administered 2015-05-11: 400 mg via INTRAVENOUS
  Filled 2015-05-11: qty 200

## 2015-05-11 MED ORDER — LACTATED RINGERS IV SOLN
INTRAVENOUS | Status: DC
  Administered 2015-05-11: 12:00:00 via INTRAVENOUS
  Filled 2015-05-11: qty 1000

## 2015-05-11 MED ORDER — FENTANYL CITRATE (PF) 100 MCG/2ML IJ SOLN
INTRAMUSCULAR | Status: AC
  Filled 2015-05-11: qty 2

## 2015-05-11 MED ORDER — ETOMIDATE 2 MG/ML IV SOLN
INTRAVENOUS | Status: AC
  Filled 2015-05-11: qty 10

## 2015-05-11 MED ORDER — LIDOCAINE HCL (CARDIAC) 20 MG/ML IV SOLN
INTRAVENOUS | Status: AC
  Filled 2015-05-11: qty 5

## 2015-05-11 MED ORDER — ONDANSETRON HCL 4 MG/2ML IJ SOLN
INTRAMUSCULAR | Status: AC
  Filled 2015-05-11: qty 2

## 2015-05-11 MED ORDER — ARTIFICIAL TEARS OP OINT
TOPICAL_OINTMENT | OPHTHALMIC | Status: AC
  Filled 2015-05-11: qty 3.5

## 2015-05-11 MED ORDER — FENTANYL CITRATE (PF) 100 MCG/2ML IJ SOLN
25.0000 ug | INTRAMUSCULAR | Status: DC | PRN
Start: 2015-05-11 — End: 2015-05-11
  Filled 2015-05-11: qty 1

## 2015-05-11 MED ORDER — ETOMIDATE 2 MG/ML IV SOLN
INTRAVENOUS | Status: DC | PRN
  Administered 2015-05-11: 14 mg via INTRAVENOUS

## 2015-05-11 MED ORDER — PROMETHAZINE HCL 25 MG/ML IJ SOLN
6.2500 mg | INTRAMUSCULAR | Status: DC | PRN
  Filled 2015-05-11: qty 1

## 2015-05-11 MED ORDER — PROPOFOL 10 MG/ML IV BOLUS
INTRAVENOUS | Status: AC
  Filled 2015-05-11: qty 20

## 2015-05-11 MED ORDER — FENTANYL CITRATE (PF) 100 MCG/2ML IJ SOLN
INTRAMUSCULAR | Status: DC | PRN
  Administered 2015-05-11 (×3): 25 ug via INTRAVENOUS

## 2015-05-11 MED ORDER — STERILE WATER FOR IRRIGATION IR SOLN
Status: DC | PRN
  Administered 2015-05-11: 3000 mL

## 2015-05-11 MED ORDER — ONDANSETRON HCL 4 MG/2ML IJ SOLN
INTRAMUSCULAR | Status: DC | PRN
  Administered 2015-05-11: 4 mg via INTRAVENOUS

## 2015-05-11 MED ORDER — PROPOFOL 10 MG/ML IV BOLUS
INTRAVENOUS | Status: DC | PRN
  Administered 2015-05-11: 50 mg via INTRAVENOUS

## 2015-05-11 MED ORDER — PHENYLEPHRINE HCL 10 MG/ML IJ SOLN
INTRAMUSCULAR | Status: DC | PRN
  Administered 2015-05-11 (×2): 80 ug via INTRAVENOUS

## 2015-05-11 MED ORDER — CIPROFLOXACIN IN D5W 400 MG/200ML IV SOLN
INTRAVENOUS | Status: AC
  Filled 2015-05-11: qty 200

## 2015-05-11 MED ORDER — LIDOCAINE HCL (CARDIAC) 20 MG/ML IV SOLN
INTRAVENOUS | Status: DC | PRN
  Administered 2015-05-11: 60 mg via INTRAVENOUS

## 2015-05-11 MED ORDER — PHENYLEPHRINE 40 MCG/ML (10ML) SYRINGE FOR IV PUSH (FOR BLOOD PRESSURE SUPPORT)
PREFILLED_SYRINGE | INTRAVENOUS | Status: AC
  Filled 2015-05-11: qty 10

## 2015-05-11 MED ORDER — MEPERIDINE HCL 25 MG/ML IJ SOLN
6.2500 mg | INTRAMUSCULAR | Status: DC | PRN
  Filled 2015-05-11: qty 1

## 2015-05-11 MED ORDER — CEFAZOLIN SODIUM-DEXTROSE 2-3 GM-% IV SOLR
INTRAVENOUS | Status: DC | PRN
  Administered 2015-05-11: 2 g via INTRAVENOUS

## 2015-05-11 SURGICAL SUPPLY — 24 items
ADAPTER CATH URET PLST 4-6FR (CATHETERS) IMPLANT
BAG DRAIN URO-CYSTO SKYTR STRL (DRAIN) ×3 IMPLANT
BALLN NEPHROSTOMY (BALLOONS) ×3
BALLOON NEPHROSTOMY (BALLOONS) ×1 IMPLANT
CANISTER SUCT LVC 12 LTR MEDI- (MISCELLANEOUS) IMPLANT
CATH ROBINSON RED A/P 14FR (CATHETERS) ×3 IMPLANT
CATH URET 5FR 28IN CONE TIP (BALLOONS)
CATH URET 5FR 70CM CONE TIP (BALLOONS) IMPLANT
CLOTH BEACON ORANGE TIMEOUT ST (SAFETY) ×3 IMPLANT
GLOVE BIO SURGEON STRL SZ7 (GLOVE) ×6 IMPLANT
GLOVE BIO SURGEON STRL SZ7.5 (GLOVE) ×3 IMPLANT
GOWN STRL REUS W/ TWL LRG LVL3 (GOWN DISPOSABLE) ×1 IMPLANT
GOWN STRL REUS W/ TWL XL LVL3 (GOWN DISPOSABLE) ×1 IMPLANT
GOWN STRL REUS W/TWL LRG LVL3 (GOWN DISPOSABLE) ×3
GOWN STRL REUS W/TWL XL LVL3 (GOWN DISPOSABLE) ×2
GUIDEWIRE STR DUAL SENSOR (WIRE) ×3 IMPLANT
KIT ROOM TURNOVER WOR (KITS) ×3 IMPLANT
MANIFOLD NEPTUNE II (INSTRUMENTS) ×3 IMPLANT
NS IRRIG 500ML POUR BTL (IV SOLUTION) IMPLANT
PACK CYSTO (CUSTOM PROCEDURE TRAY) ×3 IMPLANT
SYRINGE IRR TOOMEY STRL 70CC (SYRINGE) IMPLANT
TUBE CONNECTING 12'X1/4 (SUCTIONS)
TUBE CONNECTING 12X1/4 (SUCTIONS) IMPLANT
WATER STERILE IRR 3000ML UROMA (IV SOLUTION) ×3 IMPLANT

## 2015-05-11 NOTE — Discharge Instructions (Addendum)
I have reviewed discharge instructions in detail with the patient. They will follow-up with me or their physician as scheduled. My nurse will also be calling the patients as per protocol.  Patient to self cath and see Dr. Matilde Sprang prior to discharge.  CYSTOSCOPY HOME CARE INSTRUCTIONS  Activity: Rest for the remainder of the day.  Do not drive or operate equipment today.  You may resume normal activities in one to two days as instructed by your physician.   Meals: Drink plenty of liquids and eat light foods such as gelatin or soup this evening.  You may return to a normal meal plan tomorrow.  Return to Work: You may return to work in one to two days or as instructed by your physician.  Special Instructions / Symptoms: Call your physician if any of these symptoms occur:   -persistent or heavy bleeding  -bleeding which continues after first few urination  -large blood clots that are difficult to pass  -urine stream diminishes or stops completely  -fever equal to or higher than 101 degrees Farenheit.  -cloudy urine with a strong, foul odor  -severe pain  Females should always wipe from front to back after elimination.  You may feel some burning pain when you urinate.  This should disappear with time.  Applying moist heat to the lower abdomen or a hot tub bath may help relieve the pain. \  Follow-Up / Date of Return Visit to Your Physician:   Call for an appointment to arrange follow-up.  Patient Signature:  ________________________________________________________  Nurse's Signature:  ________________________________________________________    Post Anesthesia Home Care Instructions  Activity: Get plenty of rest for the remainder of the day. A responsible adult should stay with you for 24 hours following the procedure.  For the next 24 hours, DO NOT: -Drive a car -Paediatric nurse -Drink alcoholic beverages -Take any medication unless instructed by your physician -Make any  legal decisions or sign important papers.  Meals: Start with liquid foods such as gelatin or soup. Progress to regular foods as tolerated. Avoid greasy, spicy, heavy foods. If nausea and/or vomiting occur, drink only clear liquids until the nausea and/or vomiting subsides. Call your physician if vomiting continues.  Special Instructions/Symptoms: Your throat may feel dry or sore from the anesthesia or the breathing tube placed in your throat during surgery. If this causes discomfort, gargle with warm salt water. The discomfort should disappear within 24 hours.  If you had a scopolamine patch placed behind your ear for the management of post- operative nausea and/or vomiting:  1. The medication in the patch is effective for 72 hours, after which it should be removed.  Wrap patch in a tissue and discard in the trash. Wash hands thoroughly with soap and water. 2. You may remove the patch earlier than 72 hours if you experience unpleasant side effects which may include dry mouth, dizziness or visual disturbances. 3. Avoid touching the patch. Wash your hands with soap and water after contact with the patch.

## 2015-05-11 NOTE — Anesthesia Procedure Notes (Signed)
Procedure Name: LMA Insertion Date/Time: 05/11/2015 12:09 PM Performed by: Mechele Claude Pre-anesthesia Checklist: Patient identified, Emergency Drugs available, Suction available and Patient being monitored Patient Re-evaluated:Patient Re-evaluated prior to inductionOxygen Delivery Method: Circle System Utilized Preoxygenation: Pre-oxygenation with 100% oxygen Intubation Type: IV induction Ventilation: Mask ventilation without difficulty LMA: LMA inserted LMA Size: 4.0 Number of attempts: 1 Airway Equipment and Method: bite block Placement Confirmation: positive ETCO2 Tube secured with: Tape Dental Injury: Teeth and Oropharynx as per pre-operative assessment

## 2015-05-11 NOTE — H&P (Signed)
History of Present Illness   [name] has a neurogenic bladder on clean intermittent catheterization. He had a little bit of fullness on an ultrasound in the past that I thought was within normal limits. He went on to be found to have left hydronephrosis on a CT scan, which might be from reflux or based upon bladder volume. He performs clean intermittent catheterization. He has had a fairly low-pressure bladder on urodynamics with significant left-sided reflux on his video urodynamics December 2014.   I did not cystoscope him last time since I thought he was infected clinically and his culture was positive. He has had blood in his urine with positive cultures before. He is on chronic doxazosin and finasteride. He has been having left lower-quadrant pain and I was going to consider placing him on prophylaxis.    When I saw [name] in December 2015, he did have bilateral renal atrophy. He has had friable blood vessels in the prostatic urethra cystoscopically. He has had a little bit more trouble catheterizing, and he has had positive cultures. I did not put him on prophylaxis but gave him a Septra prescription. Culture was not performed last December.   [name] at least once a day has trouble with catheterizing. He will change directions or switch a catheter or relubricate it. He is catheterizing 4 to 6 times a day. He has had a little blood on the catheter.   There are no other modifying factors, associated signs or symptoms. There are no aggravating or relieving factors. Presentation is mild in severity and recurring.   Urinalysis: A few bacteria. Urine sent for culture.    Past Medical History Problems  1. History of Fracture Of The Vertebral Column 2. History of hypothyroidism (Z86.39)  Surgical History Problems  1. History of Back Surgery 2. History of Inguinal Hernia Repair  Current Meds 1. Centrum Silver TABS;  Therapy: (Recorded:16Feb2009) to Recorded 2. Ciprofloxacin  HCl - 250 MG Oral Tablet; TAKE 1 TABLET 3 times daily;  Therapy: 20Oct2014 to (Evaluate:27Oct2014)  Requested for: 20Oct2014; Last  Rx:20Oct2014 Ordered 3. Ciprofloxacin HCl - 250 MG Oral Tablet; TAKE 1 TABLET EVERY 12 HOURS DAILY;  Therapy: 20Oct2014 to (Evaluate:27Oct2014)  Requested for: 20Oct2014; Last  Rx:20Oct2014 Ordered 4. Ciprofloxacin HCl - 250 MG Oral Tablet; Take 1 tablet twice daily;  Therapy: [id number] to (Evaluate:19Mar2015)  Requested for: 20Mar2015; Last  Rx:12Mar2015; Status: ACTIVE - Renewal Denied Ordered 5. Doxazosin Mesylate 4 MG Oral Tablet; TAKE 1 TABLET BY MOUTH ONCE A DAY;  Therapy: [id number] to (Evaluate:14Jan2017)  Requested for: 21Jan2016; Last  Rx:20Jan2016 Ordered 6. Finasteride 5 MG Oral Tablet; Take 1 tablet daily;  Therapy: 20Jan2016 to (Evaluate:14Jan2017)  Requested for: 21Jan2016; Last  Rx:20Jan2016 Ordered 7. Lovastatin 20 MG Oral Tablet;  Therapy: [id number] to Recorded 8. Niacin TABS;  Therapy: (Recorded:16Feb2009) to Recorded 9. SMZ-TMP DS 800-160 MG TABS; take 1 tablet bid for 7 days;  Therapy: [id number] to (Last Rx:17Dec2015)  Requested for: [id number] Ordered 10. Sulfamethoxazole-TMP DS 800-160 MG TABS; TAKE 1 TABLET TWICE DAILY;   Therapy: 06Apr2015 to (Evaluate:13Apr2015)  Requested for: 06Apr2015; Last   Rx:06Apr2015 Ordered 11. Synthroid TABS;   Therapy: (Recorded:17Dec2007) to Recorded  Allergies Medication  1. Nitrofurantoin CAPS  Family History Problems  1. Family history of Death In The Family Father   Deceased at age 69 2. Family history of Death In The Family Mother   Deceased at age 80 3. Family history of Family Health Status Number Of Children   2  Social History Problems  1. Denied: Alcohol Use (History) 2. Caffeine Use   2 3. Former smoker [phone])   Smoked for 20 yrs & quit in 1987 4. Marital History - Currently Married  Vitals Vital Signs [Data Includes: Last 1 Day]  Recorded: [id number] 11:19AM   Height: 5 ft 10 in Weight: 203 lb  BMI Calculated: 29.13 BSA Calculated: 2.1 Blood Pressure: 94 / 60 Temperature: 98 F Heart Rate: 79  Results/Data  Urine [Data Includes: Last 1 Day]   [id number]  COLOR YELLOW   APPEARANCE CLEAR   SPECIFIC GRAVITY <1.005   pH 7.0   GLUCOSE NEGATIVE   BILIRUBIN NEGATIVE   KETONE NEGATIVE   BLOOD TRACE   PROTEIN NEGATIVE   NITRITE NEGATIVE   LEUKOCYTE ESTERASE NEGATIVE   SQUAMOUS EPITHELIAL/HPF 0-5 HPF  WBC 10-20 WBC/HPF  RBC 0-2 RBC/HPF  BACTERIA FEW HPF  CRYSTALS NONE SEEN HPF  CASTS NONE SEEN LPF  Yeast NONE SEEN HPF   Assessment Assessed  1. 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 Chronic cystitis (N30.20)  Plan Chronic cystitis  1. Follow-up Schedule Surgery Office  Follow-up  Status: Complete  Done: [id number] 2. Follow-up Year x 1 Office  Follow-up  Status: Canceled 3. Cysto; Status:Complete;   [id number] 4. RENAL U/S COMPLETE; Status:Canceled;   Discussion/Summary   Clinically [name] was not infected but I did send his urine for culture.   [name] underwent a flexible cystoscopy utilizing sterile technique after verbal consent. The penile and most of the bulbar urethra looked normal. He had some ringlets or mild scar proximally. I think it was just at the membranous urethra or beyond that he had approximately a 14-French stricture or less. I could not negotiate through it. I did not see a false passage.   He says when he does void, he is having some burning.   Because of the burning, I thought it was best to call him in an antibiotic empirically.   I drew him a picture and walked him through balloon dilatation of urethral stricture. I will also check him for a false passage and perhaps even change his catheter type if it was prudent in the future.   We talked about balloon dilation in detail. Pros, cons, general surgical and anesthetic risks, and other options including watchful waiting were discussed. He understands  that dilation is generally successful in most cases but long-term success rates are low. We talked about the risk of persistent, de novo, or worsening incontinence and voiding dysfunction. Risks were described but not limited to the discussion of injury to neighboring structures and soft tissues. Bleeding, infection, pain, erectile dysfunction, and spraying of urination were discussed. The risk of neuropathy was discussed as well as the usual post-operative course.  After a thorough review of the management options for the patient's condition the patient  elected to proceed with surgical therapy as noted above. We have discussed the potential benefits and risks of the procedure, side effects of the proposed treatment, the likelihood of the patient achieving the goals of the procedure, and any potential problems that might occur during the procedure or recuperation. Informed consent has been obtained.

## 2015-05-11 NOTE — Transfer of Care (Signed)
Last Vitals:  Filed Vitals:   05/11/15 1034  BP: 123/58  Pulse: 80  Temp: 36.9 C  Resp: 16   Immediate Anesthesia Transfer of Care Note  Patient: Andrew [name]  Procedure(s) Performed: Procedure(s) (LRB): CYSTO WITH URETHROGRAM (N/A) BALLOON DILATION (N/A)  Patient Location: PACU  Anesthesia Type: General  Level of Consciousness: awake, alert  and oriented  Airway & Oxygen Therapy: Patient Spontanous Breathing and Patient connected to face mask oxygen  Post-op Assessment: Report given to PACU RN and Post -op Vital signs reviewed and stable  Post vital signs: Reviewed and stable  Complications: No apparent anesthesia complications

## 2015-05-11 NOTE — Anesthesia Preprocedure Evaluation (Addendum)
Anesthesia Evaluation  Patient identified by MRN, date of birth, ID band Patient awake    Reviewed: Allergy & Precautions, NPO status , Patient's Chart, lab work & pertinent test results  Airway Mallampati: II  TM Distance: >3 FB Neck ROM: Full    Dental no notable dental hx. (+) Partial Upper, Poor Dentition, Loose, Chipped   Pulmonary former smoker,    Pulmonary exam normal breath sounds clear to auscultation       Cardiovascular + Peripheral Vascular Disease  Normal cardiovascular exam Rhythm:Regular Rate:Normal     Neuro/Psych negative neurological ROS  negative psych ROS   GI/Hepatic negative GI ROS, Neg liver ROS,   Endo/Other  negative endocrine ROS  Renal/GU negative Renal ROS  negative genitourinary   Musculoskeletal negative musculoskeletal ROS (+)   Abdominal   Peds negative pediatric ROS (+)  Hematology  (+) anemia , MDS (myelodysplastic syndrome), low grade (HCC)   Anesthesia Other Findings   Reproductive/Obstetrics negative OB ROS                            Anesthesia Physical Anesthesia Plan  ASA: III  Anesthesia Plan: General   Post-op Pain Management:    Induction: Intravenous  Airway Management Planned: LMA  Additional Equipment:   Intra-op Plan:   Post-operative Plan: Extubation in OR  Informed Consent: I have reviewed the patients History and Physical, chart, labs and discussed the procedure including the risks, benefits and alternatives for the proposed anesthesia with the patient or authorized representative who has indicated his/her understanding and acceptance.   Dental advisory given  Plan Discussed with: CRNA  Anesthesia Plan Comments:         Anesthesia Quick Evaluation

## 2015-05-11 NOTE — Interval H&P Note (Signed)
History and Physical Interval Note:  05/11/2015 7:17 AM  Andrew [name]  has presented today for surgery, with the diagnosis of URETHERAL STRICTURE  The various methods of treatment have been discussed with the patient and family. After consideration of risks, benefits and other options for treatment, the patient has consented to  Procedure(s): CYSTO WITH RETROGRADE URETHROGRAM (N/A) BALLOON DILATION (N/A) as a surgical intervention .  The patient's history has been reviewed, patient examined, no change in status, stable for surgery.  I have reviewed the patient's chart and labs.  Questions were answered to the patient's satisfaction.     [name] A

## 2015-05-11 NOTE — Op Note (Signed)
Preoperative diagnosis: Urethral stricture Postoperative diagnosis: Urethral stricture Surgery: Cystoscopy; retrograde urethrogram and balloon dilation of stricture Surgeon: Dr. Nicki Reaper [facility]  The patient is above diagnoses and consented above procedure. He was given preoperative antibiotics.  Leg position was excellent  17 [place] scope utilized. Penile bulbar urethra normal. He had mild stricture just distal to the ureters sphincter almost like a ringlet's.  Retrograde urethrogram:: I did a retrograde urethrogram with 10 mL of contrast. The penis on stretch. It was in AP view. He had a normal caliber urethra mild narrowing near the membranous sphincter. The dye finally went to the prostatic urethra into the bladder. There were no bony or pelvic abnormalities noted  Under fluoroscopic and cystoscopic guidance I passed a sensor wire up curling in the bladder. I passed the balloon dilated patient catheter well-prepared to the appropriate level. I balloon dilated for 4 minutes at 18 atm of pressure  The balloon was deflated. It was removed. I cystoscoped the patient and remove the wire. His urine was cloudy and sent for culture so added 2 g of Ancef coverage. He grade 2 or grade 3-4 bladder trabeculation. I believe he had a few saccules.  The prosthetic urethra was open. He had excellent dilation of mild stricture just distal to the sphincter. He had no false passage. He no local findings causing hematuria other than a possible urinary tract infection  Patient was taken to recovery

## 2015-05-11 NOTE — Anesthesia Postprocedure Evaluation (Signed)
Anesthesia Post Note  Patient: [name]  Procedure(s) Performed: Procedure(s) (LRB): CYSTO WITH URETHROGRAM (N/A) BALLOON DILATION (N/A)  Patient location during evaluation: PACU Anesthesia Type: General Level of consciousness: awake and alert Pain management: pain level controlled Vital Signs Assessment: post-procedure vital signs reviewed and stable Respiratory status: spontaneous breathing, nonlabored ventilation, respiratory function stable and patient connected to nasal cannula oxygen Cardiovascular status: blood pressure returned to baseline and stable Postop Assessment: no signs of nausea or vomiting Anesthetic complications: no    Last Vitals:  Filed Vitals:   05/11/15 1248 05/11/15 1300  BP:  120/63  Pulse: 76 70  Temp: 36.6 C   Resp:  16    Last Pain: There were no vitals filed for this visit.               Montez Hageman

## 2015-05-12 ENCOUNTER — Encounter (HOSPITAL_BASED_OUTPATIENT_CLINIC_OR_DEPARTMENT_OTHER): Payer: Self-pay | Admitting: Urology

## 2015-05-16 LAB — URINE CULTURE

## 2015-05-17 ENCOUNTER — Encounter: Payer: Self-pay | Admitting: Hematology & Oncology

## 2015-05-25 ENCOUNTER — Ambulatory Visit: Payer: Medicare Other

## 2015-05-25 ENCOUNTER — Other Ambulatory Visit: Payer: Medicare Other

## 2015-05-25 ENCOUNTER — Ambulatory Visit: Payer: Medicare Other | Admitting: Hematology & Oncology

## 2015-05-26 ENCOUNTER — Other Ambulatory Visit (HOSPITAL_BASED_OUTPATIENT_CLINIC_OR_DEPARTMENT_OTHER): Payer: Medicare Other

## 2015-05-26 ENCOUNTER — Ambulatory Visit (HOSPITAL_BASED_OUTPATIENT_CLINIC_OR_DEPARTMENT_OTHER): Payer: Medicare Other

## 2015-05-26 ENCOUNTER — Encounter: Payer: Self-pay | Admitting: Hematology & Oncology

## 2015-05-26 ENCOUNTER — Ambulatory Visit (HOSPITAL_BASED_OUTPATIENT_CLINIC_OR_DEPARTMENT_OTHER): Payer: Medicare Other | Admitting: Hematology & Oncology

## 2015-05-26 VITALS — BP 93/49 | HR 81 | Temp 98.3°F | Resp 14 | Ht 69.0 in

## 2015-05-26 DIAGNOSIS — D4620 Refractory anemia with excess of blasts, unspecified: Secondary | ICD-10-CM

## 2015-05-26 DIAGNOSIS — D46Z Other myelodysplastic syndromes: Secondary | ICD-10-CM

## 2015-05-26 LAB — CBC WITH DIFFERENTIAL (CANCER CENTER ONLY)
HCT: 29.2 % — ABNORMAL LOW (ref 38.7–49.9)
HGB: 8.6 g/dL — ABNORMAL LOW (ref 13.0–17.1)
MCH: 29.5 pg (ref 28.0–33.4)
MCHC: 29.5 g/dL — ABNORMAL LOW (ref 32.0–35.9)
MCV: 100 fL — AB (ref 82–98)
PLATELETS: 119 10*3/uL — AB (ref 145–400)
RBC: 2.92 10*6/uL — ABNORMAL LOW (ref 4.20–5.70)
RDW: 24.1 % — AB (ref 11.1–15.7)
WBC: 4.8 10*3/uL (ref 4.0–10.0)

## 2015-05-26 LAB — MANUAL DIFFERENTIAL (CHCC SATELLITE)
ALC: 1 10*3/uL (ref 0.9–3.3)
ANC (CHCC HP manual diff): 2.8 10*3/uL (ref 1.5–6.5)
LYMPH: 22 % (ref 14–48)
MONO: 19 % — AB (ref 0–13)
PLT EST ~~LOC~~: DECREASED
SEG: 59 % (ref 40–75)

## 2015-05-26 LAB — RETICULOCYTES
ABS RETIC: 67.9 10*3/uL (ref 19.0–186.0)
RBC.: 2.95 MIL/uL — AB (ref 4.22–5.81)
RETIC CT PCT: 2.3 % (ref 0.4–2.3)

## 2015-05-26 LAB — COMPREHENSIVE METABOLIC PANEL
ALT: <9 U/L (ref 0–55)
ANION GAP: 9 meq/L (ref 3–11)
AST: 15 U/L (ref 5–34)
Albumin: 2.5 g/dL — ABNORMAL LOW (ref 3.5–5.0)
Alkaline Phosphatase: 59 U/L (ref 40–150)
BILIRUBIN TOTAL: 0.44 mg/dL (ref 0.20–1.20)
BUN: 9 mg/dL (ref 7.0–26.0)
CALCIUM: 8.3 mg/dL — AB (ref 8.4–10.4)
CHLORIDE: 101 meq/L (ref 98–109)
CO2: 25 meq/L (ref 22–29)
Creatinine: 0.8 mg/dL (ref 0.7–1.3)
EGFR: 82 mL/min/{1.73_m2} — AB (ref >90)
Glucose: 112 mg/dl (ref 70–140)
Potassium: 3.8 mEq/L (ref 3.5–5.1)
Sodium: 135 mEq/L — ABNORMAL LOW (ref 136–145)
TOTAL PROTEIN: 8.5 g/dL — AB (ref 6.4–8.3)

## 2015-05-26 MED ORDER — DARBEPOETIN ALFA 300 MCG/0.6ML IJ SOSY
300.0000 ug | PREFILLED_SYRINGE | Freq: Once | INTRAMUSCULAR | Status: AC
  Administered 2015-05-26: 300 ug via SUBCUTANEOUS

## 2015-05-26 MED ORDER — DARBEPOETIN ALFA 300 MCG/0.6ML IJ SOSY
PREFILLED_SYRINGE | INTRAMUSCULAR | Status: AC
  Filled 2015-05-26: qty 0.6

## 2015-05-26 NOTE — Progress Notes (Signed)
Hematology and Oncology Follow Up Visit  Andrew [name] [id number] 10-17-1932 81 y.o. 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  Surgeon is back for followup. He continues to decline. He just is not feeling well. He has a bad urinary tract infection that appears to be chronic. From the labs, looks like he is growing both enterococcus and Pseudomonas.  He goes back to see the urologist in early January. I suspect that he probably will need some type of IV antibiotics for these bacteria.  Because of this, he just is not feeling well. He's had no fever. He just feels very tired and worn out.  He does have both the myelodysplasia and the hemochromatosis. I worry that the myelodysplasia is worsening. Going away for [place] know this would be to do a bone marrow biopsy on him. I taught him about this. He does not want have this done.  He does have the hemochromatosis. His last iron studies back in early December, showed a ferritin of 1461. His total iron was only 110. Hence, the ferritin might be partly elevated because of the chronic urinary tract infection.   He says food does not taste all that good.   Overall , his performance status is ECOG 2     Medications:  Current outpatient prescriptions:  .  cephALEXin (KEFLEX) 500 MG capsule, TAKE 1 CAPSULE 3 TIMES DAILY., Disp: , Rfl: 0 .  cephALEXin (KEFLEX) 500 MG capsule, Take 1 capsule (500 mg total) by mouth 3 (three) times daily., Disp: 15 capsule, Rfl: 0 .  doxazosin (CARDURA) 4 MG tablet, Take 4 mg by mouth at bedtime. , Disp: , Rfl:  .  finasteride (PROSCAR) 5 MG tablet, Take 5 mg by mouth every morning. , Disp: , Rfl:  .  GENERLAC 10 GM/15ML SOLN, TAKE 15 ML BY MOUTH 3 TIMES A  DAY AS NEEDED, Disp: 480 mL, Rfl: 3 .  KLOR-CON M20 20 MEQ tablet, Take 20 mEq by mouth daily., Disp: , Rfl: 3 .  lactulose (CHRONULAC) 10 GM/15ML solution, Take 15 mLs (10 g total) by mouth 3 (three) times daily. As needed., Disp: 480 mL, Rfl: 3 .  levothyroxine (SYNTHROID) 137 MCG tablet, Take 137 mcg by mouth daily before breakfast., Disp: , Rfl:  .  lovastatin (MEVACOR) 20 MG tablet, Take 20 mg by mouth at bedtime. , Disp: , Rfl:  .  Polyvinyl Alcohol-Povidone (REFRESH OP), Place 1 drop into both eyes daily as needed (dry eyes)., Disp: , Rfl:  No current facility-administered medications for this visit.  Facility-Administered Medications Ordered in Other Visits:  .  Influenza vac split quadrivalent PF (FLUARIX) injection 0.5 mL, 0.5 mL, Intramuscular, Once, Volanda Napoleon, MD, 0.5 mL at 04/07/15 1604  Allergies: No Known Allergies  Past Medical History, Surgical history, Social history, and Family History were reviewed and updated.  Review of Systems: As above  Physical Exam:  height is _0  (1.753 m). His oral temperature is 98.3 F (36.8 C). His blood pressure is 93/49 and his pulse is 81. His respiration is 14.   Elderly white male who is somewhat thin. He looks like he is in chronic distress.ead and neck exam shows no ocular or oral lesions. There are no palpable  cervical or supraclavicular lymph nodes. Lungs are clear. Cardiac exam regular rate and rhythm with no murmurs, rubs or bruits.. Abdomen is soft. He has good bowel sounds. There is no fluid wave. There is a well-healed laparotomy scar from his aneurysm repair. There is no palpable liver or spleen tip.  rectal exam shows some external hemorrhoids. No masses are noted in the rectal vault. Stool is heme-negative. Extremities shows no clubbing, cyanosis or edema. Skin exam no rashes. He has some scattered ecchymoses. Neurological exam shows some slight decrease in sensation in his legs.  Lab Results  Component Value Date    WBC 4.8 05/26/2015   HGB 8.6* 05/26/2015   HCT 29.2* 05/26/2015   MCV 100* 05/26/2015   PLT 119* 05/26/2015     Chemistry      Component Value Date/Time   NA 133* 05/05/2015 1402   NA 135* 04/21/2015 1416   NA 139 04/06/2015 1056   K 3.8 05/05/2015 1402   K 4.3 04/21/2015 1416   K 4.4 04/06/2015 1056   CL 103 05/05/2015 1402   CL 100 04/06/2015 1056   CO2 25 05/05/2015 1402   CO2 25 04/21/2015 1416   CO2 25 04/06/2015 1056   BUN 12 05/05/2015 1402   BUN 8.9 04/21/2015 1416   BUN 12 04/06/2015 1056   CREATININE 0.85 05/05/2015 1402   CREATININE 0.8 04/21/2015 1416   CREATININE 0.9 04/06/2015 1056      Component Value Date/Time   CALCIUM 7.7* 05/05/2015 1402   CALCIUM 8.1* 04/21/2015 1416   CALCIUM 8.5 04/06/2015 1056   ALKPHOS 68 05/05/2015 1402   ALKPHOS 66 04/21/2015 1416   ALKPHOS 46 04/06/2015 1056   AST 16 05/05/2015 1402   AST 23 04/21/2015 1416   AST 23 04/06/2015 1056   ALT 11 05/05/2015 1402   ALT 14 04/21/2015 1416   ALT 15 04/06/2015 1056   BILITOT 0.5 05/05/2015 1402   BILITOT 0.62 04/21/2015 1416   BILITOT 0.80 04/06/2015 1056         Impression and Plan: Andrew [name] is 81 year old white male. He has both myelodysplasia and hemochromatosis. He has an incredible amount of iron. This is probably due to hemachromatosis in addition to ineffective erythropoiesis.  I think part of the problem with him not feeling well and with this chronic anemia is partly from this urinary tract infection. I've seen it before that patients are just chronically anemic and suppressed because of a urinary tract infection. If this can be treated and improved, it would be very interesting to see what the anemia does.  I will go ahead and give him his Aranesp today. I'll like to try to get him back in 2 weeks and we can see what things look like.  I spoke to him about the possibility of doing a bone marrow biopsy. It is clearly possible that he may have worsening  myelodysplasia and that also could be causing his anemia to worsen.  I see on the system that he has enterococcus and has Pseudomonas in his urine. I can understand why he doesn't feel well.  I spent about 35 minutes with he and his wife today.   Volanda Napoleon, MD 12/23/20161:09 PM

## 2015-05-26 NOTE — Patient Instructions (Signed)
Darbepoetin Alfa injection What is this medicine? DARBEPOETIN ALFA (dar be POE e tin AL fa) helps your body make more red blood cells. It is used to treat anemia caused by chronic kidney failure and chemotherapy. This medicine may be used for other purposes; ask your health care provider or pharmacist if you have questions. What should I tell my health care provider before I take this medicine? They need to know if you have any of these conditions: -blood clotting disorders or history of blood clots -cancer patient not on chemotherapy -cystic fibrosis -heart disease, such as angina, heart failure, or a history of a heart attack -hemoglobin level of 12 g/dL or greater -high blood pressure -low levels of folate, iron, or vitamin B12 -seizures -an unusual or allergic reaction to darbepoetin, erythropoietin, albumin, hamster proteins, latex, other medicines, foods, dyes, or preservatives -pregnant or trying to get pregnant -breast-feeding How should I use this medicine? This medicine is for injection into a vein or under the skin. It is usually given by a health care professional in a hospital or clinic setting. If you get this medicine at home, you will be taught how to prepare and give this medicine. Do not shake the solution before you withdraw a dose. Use exactly as directed. Take your medicine at regular intervals. Do not take your medicine more often than directed. It is important that you put your used needles and syringes in a special sharps container. Do not put them in a trash can. If you do not have a sharps container, call your pharmacist or healthcare provider to get one. Talk to your pediatrician regarding the use of this medicine in children. While this medicine may be used in children as young as 1 year for selected conditions, precautions do apply. Overdosage: If you think you have taken too much of this medicine contact a poison control center or emergency room at once. NOTE:  This medicine is only for you. Do not share this medicine with others. What if I miss a dose? If you miss a dose, take it as soon as you can. If it is almost time for your next dose, take only that dose. Do not take double or extra doses. What may interact with this medicine? Do not take this medicine with any of the following medications: -epoetin alfa This list may not describe all possible interactions. Give your health care provider a list of all the medicines, herbs, non-prescription drugs, or dietary supplements you use. Also tell them if you smoke, drink alcohol, or use illegal drugs. Some items may interact with your medicine. What should I watch for while using this medicine? Visit your prescriber or health care professional for regular checks on your progress and for the needed blood tests and blood pressure measurements. It is especially important for the doctor to make sure your hemoglobin level is in the desired range, to limit the risk of potential side effects and to give you the best benefit. Keep all appointments for any recommended tests. Check your blood pressure as directed. Ask your doctor what your blood pressure should be and when you should contact him or her. As your body makes more red blood cells, you may need to take iron, folic acid, or vitamin B supplements. Ask your doctor or health care provider which products are right for you. If you have kidney disease continue dietary restrictions, even though this medication can make you feel better. Talk with your doctor or health care professional about the   foods you eat and the vitamins that you take. What side effects may I notice from receiving this medicine? Side effects that you should report to your doctor or health care professional as soon as possible: -allergic reactions like skin rash, itching or hives, swelling of the face, lips, or tongue -breathing problems -changes in vision -chest pain -confusion, trouble speaking  or understanding -feeling faint or lightheaded, falls -high blood pressure -muscle aches or pains -pain, swelling, warmth in the leg -rapid weight gain -severe headaches -sudden numbness or weakness of the face, arm or leg -trouble walking, dizziness, loss of balance or coordination -seizures (convulsions) -swelling of the ankles, feet, hands -unusually weak or tired Side effects that usually do not require medical attention (report to your doctor or health care professional if they continue or are bothersome): -diarrhea -fever, chills (flu-like symptoms) -headaches -nausea, vomiting -redness, stinging, or swelling at site where injected This list may not describe all possible side effects. Call your doctor for medical advice about side effects. You may report side effects to FDA at 1-800-FDA-1088. Where should I keep my medicine? Keep out of the reach of children. Store in a refrigerator between 2 and 8 degrees C (36 and 46 degrees F). Do not freeze. Do not shake. Throw away any unused portion if using a single-dose vial. Throw away any unused medicine after the expiration date. NOTE: This sheet is a summary. It may not cover all possible information. If you have questions about this medicine, talk to your doctor, pharmacist, or health care provider.    2016, [employer]. (2008-05-03 10:23:57)  

## 2015-05-30 ENCOUNTER — Other Ambulatory Visit: Payer: Self-pay | Admitting: Nurse Practitioner

## 2015-05-30 LAB — IRON AND TIBC
Iron: 101 ug/dL (ref 42–163)
TIBC: 96 ug/dL — AB (ref 202–409)

## 2015-05-30 LAB — FERRITIN: FERRITIN: 908 ng/mL — AB (ref 22–316)

## 2015-06-13 ENCOUNTER — Ambulatory Visit: Payer: Medicare Other | Admitting: Family

## 2015-06-13 ENCOUNTER — Ambulatory Visit: Payer: Medicare Other

## 2015-06-13 ENCOUNTER — Other Ambulatory Visit: Payer: Medicare Other

## 2015-06-14 ENCOUNTER — Encounter: Payer: Self-pay | Admitting: Infectious Diseases

## 2015-06-14 ENCOUNTER — Ambulatory Visit (INDEPENDENT_AMBULATORY_CARE_PROVIDER_SITE_OTHER): Payer: Medicare Other | Admitting: Infectious Diseases

## 2015-06-14 VITALS — BP 110/67 | HR 82 | Temp 97.8°F

## 2015-06-14 DIAGNOSIS — N319 Neuromuscular dysfunction of bladder, unspecified: Secondary | ICD-10-CM | POA: Diagnosis not present

## 2015-06-14 NOTE — Assessment & Plan Note (Signed)
I spoke with the pt and his wife.  He has no f/c, no flank or supra-pubic pain.  I explained to them that he may have cloudy urine or even foul smelling urine due to poor bladder emptying.  He is unlikely to clear this infection due to poor bladder emptying, I explained that as well.  I asked them to call me if he has symptoms that would suggest acute infection- fevcer, chills, supra-pubic pain, flank pain. We will give him either further fosfomycin or start IV anbx as dictated by his Cx.  Will be available as needed until then.

## 2015-06-14 NOTE — Progress Notes (Signed)
   Subjective:    Patient ID: Andrew [name], male    DOB: September 15, 1932, 82 y.o.   MRN: [id number]  HPI 82 yo M with hx of MDS and hereditary hemochromatosis, and neurogenic bladder. He performs intermittent self cath 4-6 x /day.  Also hx of spinal fracture 04-2006- had rods/screws/plates placed. Has been wheelchair bound last 6 months, occas walker use at home.  He was seen by urology in December 22,2016 and underwent balloon dilation of urethral stricture. He had UCx growing enteroccus and pseudomonas I cefepime, ceftaz, S- amikacin pipracillin).  He was started on monurol every 3 days x 3 starting 12-29.  He is also had courses of keflex, levaquin.  Today feels "pretty good". 1 month ago "felt bad" , lethargic.  Urine still not clear. No blood. He complains of "flakes of stuff in it".  Has occas back pain- "I had a serious back operation 9 years ago".  Wife brought urine sample today.  Uses suppository to help with BM.   PMHx, FHx, Sochx reviewed, updated in EPIC.  Review of Systems  Constitutional: Negative for fever and chills.  Respiratory: Negative for shortness of breath.   Gastrointestinal: Positive for constipation. Negative for diarrhea.  Genitourinary: Positive for difficulty urinating. Negative for hematuria.  Musculoskeletal: Positive for back pain.       Objective:   Physical Exam  Constitutional: He appears well-developed and well-nourished.  HENT:  Mouth/Throat: No oropharyngeal exudate.  Eyes: EOM are normal. Pupils are equal, round, and reactive to light.  Neck: Neck supple.  Cardiovascular: Normal rate, regular rhythm and normal heart sounds.   Pulmonary/Chest: Effort normal and breath sounds normal.  Abdominal: Soft. Bowel sounds are normal. There is no tenderness. There is no rebound and no CVA tenderness.  Musculoskeletal: He exhibits no edema.  Lymphadenopathy:    He has no cervical adenopathy.      Assessment & Plan:

## 2015-06-15 ENCOUNTER — Encounter: Payer: Self-pay | Admitting: Family

## 2015-06-15 ENCOUNTER — Other Ambulatory Visit (HOSPITAL_BASED_OUTPATIENT_CLINIC_OR_DEPARTMENT_OTHER): Payer: Medicare Other

## 2015-06-15 ENCOUNTER — Ambulatory Visit (HOSPITAL_BASED_OUTPATIENT_CLINIC_OR_DEPARTMENT_OTHER): Payer: Medicare Other

## 2015-06-15 ENCOUNTER — Ambulatory Visit (HOSPITAL_BASED_OUTPATIENT_CLINIC_OR_DEPARTMENT_OTHER): Payer: Medicare Other | Admitting: Family

## 2015-06-15 VITALS — BP 102/45 | HR 75 | Temp 97.9°F | Resp 14 | Ht 69.0 in | Wt 165.0 lb

## 2015-06-15 DIAGNOSIS — D4620 Refractory anemia with excess of blasts, unspecified: Secondary | ICD-10-CM | POA: Diagnosis not present

## 2015-06-15 DIAGNOSIS — R634 Abnormal weight loss: Secondary | ICD-10-CM

## 2015-06-15 LAB — MANUAL DIFFERENTIAL (CHCC SATELLITE)
ALC: 1.3 10*3/uL (ref 0.9–3.3)
ANC (CHCC MAN DIFF): 2 10*3/uL (ref 1.5–6.5)
LYMPH: 36 % (ref 14–48)
MONO: 9 % (ref 0–13)
MYELOCYTES: 1 % — AB (ref 0–0)
PLT EST ~~LOC~~: DECREASED
SEG: 54 % (ref 40–75)

## 2015-06-15 LAB — CMP (CANCER CENTER ONLY)
ALT(SGPT): 14 U/L (ref 10–47)
AST: 18 U/L (ref 11–38)
Albumin: 2.6 g/dL — ABNORMAL LOW (ref 3.3–5.5)
Alkaline Phosphatase: 52 U/L (ref 26–84)
BUN: 12 mg/dL (ref 7–22)
CHLORIDE: 104 meq/L (ref 98–108)
CO2: 27 meq/L (ref 18–33)
CREATININE: 0.9 mg/dL (ref 0.6–1.2)
Calcium: 8.1 mg/dL (ref 8.0–10.3)
GLUCOSE: 120 mg/dL — AB (ref 73–118)
POTASSIUM: 4.6 meq/L (ref 3.3–4.7)
SODIUM: 141 meq/L (ref 128–145)
TOTAL PROTEIN: 8.5 g/dL — AB (ref 6.4–8.1)
Total Bilirubin: 0.8 mg/dl (ref 0.20–1.60)

## 2015-06-15 LAB — CBC WITH DIFFERENTIAL (CANCER CENTER ONLY)
HEMATOCRIT: 31.4 % — AB (ref 38.7–49.9)
HEMOGLOBIN: 9.3 g/dL — AB (ref 13.0–17.1)
MCH: 30.2 pg (ref 28.0–33.4)
MCHC: 29.6 g/dL — AB (ref 32.0–35.9)
MCV: 102 fL — AB (ref 82–98)
Platelets: 111 10*3/uL — ABNORMAL LOW (ref 145–400)
RBC: 3.08 10*6/uL — ABNORMAL LOW (ref 4.20–5.70)
RDW: 24.2 % — AB (ref 11.1–15.7)
WBC: 3.6 10*3/uL — AB (ref 4.0–10.0)

## 2015-06-15 MED ORDER — PREDNISONE 20 MG PO TABS: 20.0000 mg | ORAL_TABLET | Freq: Every day | ORAL | Status: DC

## 2015-06-15 MED ORDER — DARBEPOETIN ALFA 300 MCG/0.6ML IJ SOSY
PREFILLED_SYRINGE | INTRAMUSCULAR | Status: AC
  Filled 2015-06-15: qty 0.6

## 2015-06-15 MED ORDER — PREDNISONE 10 MG PO TABS: 10.0000 mg | ORAL_TABLET | Freq: Every day | ORAL | Status: DC

## 2015-06-15 MED ORDER — DARBEPOETIN ALFA 300 MCG/0.6ML IJ SOSY
300.0000 ug | PREFILLED_SYRINGE | Freq: Once | INTRAMUSCULAR | Status: AC
  Administered 2015-06-15: 300 ug via SUBCUTANEOUS

## 2015-06-15 MED FILL — predniSONE 20 MG TABS: 20 | 30 days supply | Qty: 30 | Fill #0

## 2015-06-15 NOTE — Patient Instructions (Signed)
Darbepoetin Alfa injection What is this medicine? DARBEPOETIN ALFA (dar be POE e tin AL fa) helps your body make more red blood cells. It is used to treat anemia caused by chronic kidney failure and chemotherapy. This medicine may be used for other purposes; ask your health care provider or pharmacist if you have questions. What should I tell my health care provider before I take this medicine? They need to know if you have any of these conditions: -blood clotting disorders or history of blood clots -cancer patient not on chemotherapy -cystic fibrosis -heart disease, such as angina, heart failure, or a history of a heart attack -hemoglobin level of 12 g/dL or greater -high blood pressure -low levels of folate, iron, or vitamin B12 -seizures -an unusual or allergic reaction to darbepoetin, erythropoietin, albumin, hamster proteins, latex, other medicines, foods, dyes, or preservatives -pregnant or trying to get pregnant -breast-feeding How should I use this medicine? This medicine is for injection into a vein or under the skin. It is usually given by a health care professional in a hospital or clinic setting. If you get this medicine at home, you will be taught how to prepare and give this medicine. Do not shake the solution before you withdraw a dose. Use exactly as directed. Take your medicine at regular intervals. Do not take your medicine more often than directed. It is important that you put your used needles and syringes in a special sharps container. Do not put them in a trash can. If you do not have a sharps container, call your pharmacist or healthcare provider to get one. Talk to your pediatrician regarding the use of this medicine in children. While this medicine may be used in children as young as 1 year for selected conditions, precautions do apply. Overdosage: If you think you have taken too much of this medicine contact a poison control center or emergency room at once. NOTE:  This medicine is only for you. Do not share this medicine with others. What if I miss a dose? If you miss a dose, take it as soon as you can. If it is almost time for your next dose, take only that dose. Do not take double or extra doses. What may interact with this medicine? Do not take this medicine with any of the following medications: -epoetin alfa This list may not describe all possible interactions. Give your health care provider a list of all the medicines, herbs, non-prescription drugs, or dietary supplements you use. Also tell them if you smoke, drink alcohol, or use illegal drugs. Some items may interact with your medicine. What should I watch for while using this medicine? Visit your prescriber or health care professional for regular checks on your progress and for the needed blood tests and blood pressure measurements. It is especially important for the doctor to make sure your hemoglobin level is in the desired range, to limit the risk of potential side effects and to give you the best benefit. Keep all appointments for any recommended tests. Check your blood pressure as directed. Ask your doctor what your blood pressure should be and when you should contact him or her. As your body makes more red blood cells, you may need to take iron, folic acid, or vitamin B supplements. Ask your doctor or health care provider which products are right for you. If you have kidney disease continue dietary restrictions, even though this medication can make you feel better. Talk with your doctor or health care professional about the   foods you eat and the vitamins that you take. What side effects may I notice from receiving this medicine? Side effects that you should report to your doctor or health care professional as soon as possible: -allergic reactions like skin rash, itching or hives, swelling of the face, lips, or tongue -breathing problems -changes in vision -chest pain -confusion, trouble speaking  or understanding -feeling faint or lightheaded, falls -high blood pressure -muscle aches or pains -pain, swelling, warmth in the leg -rapid weight gain -severe headaches -sudden numbness or weakness of the face, arm or leg -trouble walking, dizziness, loss of balance or coordination -seizures (convulsions) -swelling of the ankles, feet, hands -unusually weak or tired Side effects that usually do not require medical attention (report to your doctor or health care professional if they continue or are bothersome): -diarrhea -fever, chills (flu-like symptoms) -headaches -nausea, vomiting -redness, stinging, or swelling at site where injected This list may not describe all possible side effects. Call your doctor for medical advice about side effects. You may report side effects to FDA at 1-800-FDA-1088. Where should I keep my medicine? Keep out of the reach of children. Store in a refrigerator between 2 and 8 degrees C (36 and 46 degrees F). Do not freeze. Do not shake. Throw away any unused portion if using a single-dose vial. Throw away any unused medicine after the expiration date. NOTE: This sheet is a summary. It may not cover all possible information. If you have questions about this medicine, talk to your doctor, pharmacist, or health care provider.    2016, [employer]. (2008-05-03 10:23:57)  

## 2015-06-15 NOTE — Progress Notes (Signed)
Hematology and Oncology Follow Up Visit  Andrew [name] [id number] 14-Jan-1933 82 y.o. 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 He states that he is feeling some better. He is still having fatigue and not eating well. His weight is down 7 lbs since his last visit.  His appetite is decreased but he is staying hydrated.  No fever, chills, n/v, cough, rash, headache, dizziness, blurred vision, SOB, chest pain, palpitations, abdominal pain or changes in bladder habits.  He completed Monurol for his UTI and is doing well at this time. He still straight caths himself daily. He saw Dr. Bobby Rumpf with ID yesterday and will contact his office if he develops a fever or back pain.  His legs are weak and he is using a walker or his wheelchair to get around.  No swelling or tenderness in his extremities. 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 He uses a cane to ambulate at home and has had no recent falls. He is in a wheelchair today.   Medications:    Medication List       This list is accurate as of: 06/15/15  1:32 PM.  Always use your most recent med list.               cephALEXin 500 MG capsule  Commonly known as:  KEFLEX  TAKE 1 CAPSULE 3 TIMES DAILY.     cephALEXin 500 MG capsule  Commonly known as:  KEFLEX  Take 1 capsule (500 mg total) by mouth 3 (three) times daily.     doxazosin 4 MG tablet  Commonly known as:  CARDURA  Take 4 mg by mouth at bedtime.     finasteride 5 MG tablet  Commonly known as:  PROSCAR  Take 5 mg by mouth every morning.     GENERLAC 10 GM/15ML Soln  Generic drug:  lactulose (encephalopathy)  TAKE 15 ML BY MOUTH 3  TIMES A DAY AS NEEDED     KLOR-CON M20 20 MEQ tablet  Generic drug:  potassium chloride SA  Take 20 mEq by mouth daily.     lactulose 10 GM/15ML solution  Commonly known as:  CHRONULAC  Take 15 mLs (10 g total) by mouth 3 (three) times daily. As needed.     lovastatin 20 MG tablet  Commonly known as:  MEVACOR  Take 20 mg by mouth at bedtime.     predniSONE 20 MG tablet  Commonly known as:  DELTASONE  Take 1 tablet (20 mg total) by mouth daily with breakfast.     REFRESH OP  Place 1 drop into both eyes daily as needed (dry eyes).     SYNTHROID 137 MCG tablet  Generic drug:  levothyroxine  Take 137 mcg by mouth daily before breakfast.        Allergies:  Allergies  Allergen Reactions  . Nitrofuran Derivatives Other (See Comments)    ?    Past Medical History, Surgical history, Social history, and Family History were reviewed and updated.  Review of Systems: All other 10 point review of systems is negative.   Physical Exam:  height is 5\' 9"  (1.753 m) and weight is 165  lb (74.844 kg). His oral temperature is 97.9 F (36.6 C). His blood pressure is 102/45 and his pulse is 75. His respiration is 14.   Wt Readings from Last 3 Encounters:  06/15/15 165 lb (74.844 kg)  05/11/15 175 lb (79.379 kg)  05/05/15 175 lb (79.379 kg)    Ocular: Sclerae unicteric, pupils equal, round and reactive to light Ear-nose-throat: Oropharynx clear, dentition fair Lymphatic: No cervical supraclavicular or axillary adenopathy Lungs no rales or rhonchi, good excursion bilaterally Heart regular rate and rhythm, no murmur appreciated Abd soft, nontender, positive bowel sounds, no liver or spleen tip palpated on exam MSK no focal spinal tenderness, no joint edema Neuro: non-focal, well-oriented, appropriate affect  Lab Results  Component Value Date   WBC 3.6* 06/15/2015   HGB 9.3* 06/15/2015   HCT 31.4* 06/15/2015   MCV 102* 06/15/2015   PLT 111* 06/15/2015   Lab Results  Component  Value Date   FERRITIN 908* 05/26/2015   IRON 101 05/26/2015   TIBC 96* 05/26/2015   UIBC <1.0 05/26/2015   IRONPCTSAT >100 05/26/2015   Lab Results  Component Value Date   RETICCTPCT 2.3 05/26/2015   RBC 3.08* 06/15/2015   RETICCTABS 67.9 05/26/2015   No results found for: KPAFRELGTCHN, LAMBDASER, KAPLAMBRATIO No results found for: IGGSERUM, IGA, IGMSERUM No results found for: Odetta Pink, SPEI   Chemistry      Component Value Date/Time   NA 141 06/15/2015 1136   NA 135* 05/26/2015 1149   NA 133* 05/05/2015 1402   K 4.6 06/15/2015 1136   K 3.8 05/26/2015 1149   K 3.8 05/05/2015 1402   CL 104 06/15/2015 1136   CL 103 05/05/2015 1402   CO2 27 06/15/2015 1136   CO2 25 05/26/2015 1149   CO2 25 05/05/2015 1402   BUN 12 06/15/2015 1136   BUN 9.0 05/26/2015 1149   BUN 12 05/05/2015 1402   CREATININE 0.9 06/15/2015 1136   CREATININE 0.8 05/26/2015 1149   CREATININE 0.85 05/05/2015 1402      Component Value Date/Time   CALCIUM 8.1 06/15/2015 1136   CALCIUM 8.3* 05/26/2015 1149   CALCIUM 7.7* 05/05/2015 1402   ALKPHOS 52 06/15/2015 1136   ALKPHOS 59 05/26/2015 1149   ALKPHOS 68 05/05/2015 1402   AST 18 06/15/2015 1136   AST 15 05/26/2015 1149   AST 16 05/05/2015 1402   ALT 14 06/15/2015 1136   ALT <9 05/26/2015 1149   ALT 11 05/05/2015 1402   BILITOT 0.80 06/15/2015 1136   BILITOT 0.44 05/26/2015 1149   BILITOT 0.5 05/05/2015 1402     Impression and Plan: Andrew [name] is 82 year old white male with both myelodysplasia and hemochromatosis. He is feeling a little better but still fatigued and not eating well.  He continues to lose weight. We will try him on Prednisone 20 mg PO daily and see if this helps stimulate his appetite.   We will see what his iron studies show now that his UTI has been treated.  He will get Aranesp today for his Hgb of 9.3.  We will plan to see him back in 2 weeks for labs and follow-up.    He will contact us with any questions or concerns. We can certainly see him sooner if need be.   Eliezer Bottom, NP 1/12/20171:32 PM

## 2015-06-16 LAB — IRON AND TIBC
%SAT: >100 % (ref 20–?)
IRON: 107 ug/dL (ref 42–163)
TIBC: 103 ug/dL — AB (ref 202–409)
UIBC: <1 ug/dL (ref 117–376)

## 2015-06-16 LAB — RETICULOCYTES: RETICULOCYTE COUNT: 3.2 % — AB (ref 0.6–2.6)

## 2015-06-16 LAB — FERRITIN

## 2015-06-16 LAB — PREALBUMIN: PREALBUMIN: 14 mg/dL (ref 9–32)

## 2015-06-28 ENCOUNTER — Ambulatory Visit (HOSPITAL_BASED_OUTPATIENT_CLINIC_OR_DEPARTMENT_OTHER): Payer: Medicare Other

## 2015-06-28 ENCOUNTER — Encounter: Payer: Self-pay | Admitting: Family

## 2015-06-28 ENCOUNTER — Ambulatory Visit (HOSPITAL_BASED_OUTPATIENT_CLINIC_OR_DEPARTMENT_OTHER): Payer: Medicare Other | Admitting: Family

## 2015-06-28 ENCOUNTER — Other Ambulatory Visit (HOSPITAL_BASED_OUTPATIENT_CLINIC_OR_DEPARTMENT_OTHER): Payer: Medicare Other

## 2015-06-28 VITALS — BP 99/51 | HR 76 | Temp 98.1°F | Resp 16 | Ht 69.0 in | Wt 167.0 lb

## 2015-06-28 DIAGNOSIS — D4620 Refractory anemia with excess of blasts, unspecified: Secondary | ICD-10-CM

## 2015-06-28 DIAGNOSIS — R634 Abnormal weight loss: Secondary | ICD-10-CM

## 2015-06-28 DIAGNOSIS — D46Z Other myelodysplastic syndromes: Secondary | ICD-10-CM

## 2015-06-28 LAB — COMPREHENSIVE METABOLIC PANEL
ALBUMIN: 3.3 g/dL — AB (ref 3.5–5.0)
ALK PHOS: 73 U/L (ref 40–150)
ALT: 16 U/L (ref 0–55)
AST: 18 U/L (ref 5–34)
Anion Gap: 7 mEq/L (ref 3–11)
BUN: 17.1 mg/dL (ref 7.0–26.0)
CALCIUM: 8.9 mg/dL (ref 8.4–10.4)
CO2: 26 mEq/L (ref 22–29)
Chloride: 104 mEq/L (ref 98–109)
Creatinine: 0.9 mg/dL (ref 0.7–1.3)
EGFR: 78 mL/min/{1.73_m2} — AB (ref >90)
Glucose: 122 mg/dl (ref 70–140)
POTASSIUM: 4 meq/L (ref 3.5–5.1)
Sodium: 137 mEq/L (ref 136–145)
Total Bilirubin: 0.74 mg/dL (ref 0.20–1.20)
Total Protein: 8.3 g/dL (ref 6.4–8.3)

## 2015-06-28 LAB — IRON AND TIBC
IRON: 144 ug/dL (ref 42–163)
TIBC: 139 ug/dL — ABNORMAL LOW (ref 202–409)

## 2015-06-28 LAB — CBC WITH DIFFERENTIAL (CANCER CENTER ONLY)
HEMATOCRIT: 35.5 % — AB (ref 38.7–49.9)
HEMOGLOBIN: 10.7 g/dL — AB (ref 13.0–17.1)
MCH: 31.1 pg (ref 28.0–33.4)
MCHC: 30.1 g/dL — AB (ref 32.0–35.9)
MCV: 103 fL — AB (ref 82–98)
Platelets: 111 10*3/uL — ABNORMAL LOW (ref 145–400)
RBC: 3.44 10*6/uL — ABNORMAL LOW (ref 4.20–5.70)
RDW: 24.8 % — ABNORMAL HIGH (ref 11.1–15.7)
WBC: 8.6 10*3/uL (ref 4.0–10.0)

## 2015-06-28 LAB — MANUAL DIFFERENTIAL (CHCC SATELLITE)
ALC: 1.5 10*3/uL (ref 0.9–3.3)
ANC (CHCC MAN DIFF): 6.6 10*3/uL — AB (ref 1.5–6.5)
Band Neutrophils: 2 % (ref 0–10)
LYMPH: 18 % (ref 14–48)
MONO: 5 % (ref 0–13)
PLT EST ~~LOC~~: DECREASED
SEG: 75 % (ref 40–75)

## 2015-06-28 LAB — FERRITIN: Ferritin: 1573 ng/ml — ABNORMAL HIGH (ref 22–316)

## 2015-06-28 MED ORDER — DARBEPOETIN ALFA 300 MCG/0.6ML IJ SOSY
PREFILLED_SYRINGE | INTRAMUSCULAR | Status: AC
  Filled 2015-06-28: qty 0.6

## 2015-06-28 MED ORDER — DARBEPOETIN ALFA 300 MCG/0.6ML IJ SOSY
300.0000 ug | PREFILLED_SYRINGE | INTRAMUSCULAR | Status: DC
  Administered 2015-06-28: 300 ug via SUBCUTANEOUS

## 2015-06-28 NOTE — Progress Notes (Signed)
Hematology and Oncology Follow Up Visit  Andrew [name] [id number] 03-02-1933 82 y.o. 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 He states that he is feeling much better since starting the prednisone. His energy has improved as has his appetite. His weight is up 2 lbs since his last visit. He is staying well hydrated.  He has been getting up and walking with his cane for exercise. His wife stay close by him with his walker in case he needs more support. His legs are still weak.  He has no swelling in his extremities. The numbness and tingling in his hands and feet has not changed.  No fever, chills, n/v, cough, rash, headache, dizziness, blurred vision, SOB, chest pain, palpitations, abdominal pain or changes in bladder habits.  He is still having trouble completely emptying his bladder at times. He denies having any symptoms of a UTI at this time.   Medications:    Medication List       This list is accurate as of: 06/28/15 12:58 PM.  Always use your most recent med list.               cephALEXin 500 MG capsule  Commonly known as:  KEFLEX  TAKE 1 CAPSULE 3 TIMES DAILY.     cephALEXin 500 MG capsule  Commonly known as:  KEFLEX  Take 1 capsule (500 mg total) by mouth 3 (three) times daily.     doxazosin 4 MG tablet  Commonly known as:  CARDURA  Take 4 mg by mouth at bedtime.     finasteride 5 MG tablet  Commonly known as:  PROSCAR  Take 5 mg by mouth every morning.     GENERLAC 10 GM/15ML Soln  Generic drug:  lactulose (encephalopathy)  TAKE 15 ML BY MOUTH 3 TIMES A DAY AS NEEDED     KLOR-CON M20 20 MEQ tablet  Generic drug:  potassium chloride SA    Take 20 mEq by mouth daily.     lactulose 10 GM/15ML solution  Commonly known as:  CHRONULAC  Take 15 mLs (10 g total) by mouth 3 (three) times daily. As needed.     lovastatin 20 MG tablet  Commonly known as:  MEVACOR  Take 20 mg by mouth at bedtime.     predniSONE 20 MG tablet  Commonly known as:  DELTASONE  Take 1 tablet (20 mg total) by mouth daily with breakfast.     REFRESH OP  Place 1 drop into both eyes daily as needed (dry eyes).     SYNTHROID 137 MCG tablet  Generic drug:  levothyroxine  Take 137 mcg by mouth daily before breakfast.        Allergies:  Allergies  Allergen Reactions  . Nitrofuran Derivatives Other (See Comments)    ?    Past Medical History, Surgical history, Social history, and Family History were reviewed and updated.  Review of Systems: All other 10 point review of systems is negative.   Physical Exam:  height is 5\' 9"  (1.753 m) and weight is 167 lb (75.751 kg). His oral temperature is 98.1 F (36.7 C). His blood pressure is 99/51 and his pulse is 76.  His respiration is 16.   Wt Readings from Last 3 Encounters:  06/28/15 167 lb (75.751 kg)  06/15/15 165 lb (74.844 kg)  05/11/15 175 lb (79.379 kg)    Ocular: Sclerae unicteric, pupils equal, round and reactive to light Ear-nose-throat: Oropharynx clear, dentition fair Lymphatic: No cervical supraclavicular or axillary adenopathy Lungs no rales or rhonchi, good excursion bilaterally Heart regular rate and rhythm, no murmur appreciated Abd soft, nontender, positive bowel sounds, no liver or spleen tip palpated on exam MSK no focal spinal tenderness, no joint edema Neuro: non-focal, well-oriented, appropriate affect Breast: Deferred  Lab Results  Component Value Date   WBC 8.6 06/28/2015   HGB 10.7* 06/28/2015   HCT 35.5* 06/28/2015   MCV 103* 06/28/2015   PLT 111* 06/28/2015   Lab Results  Component Value Date   FERRITIN 1,094* 06/15/2015   IRON 107 06/15/2015   TIBC 103*  06/15/2015   UIBC <1.0 06/15/2015   IRONPCTSAT >100 06/15/2015   Lab Results  Component Value Date   RETICCTPCT 2.3 05/26/2015   RBC 3.44* 06/28/2015   RETICCTABS 67.9 05/26/2015   No results found for: KPAFRELGTCHN, LAMBDASER, KAPLAMBRATIO No results found for: IGGSERUM, IGA, IGMSERUM No results found for: Odetta Pink, SPEI   Chemistry      Component Value Date/Time   NA 141 06/15/2015 1136   NA 135* 05/26/2015 1149   NA 133* 05/05/2015 1402   K 4.6 06/15/2015 1136   K 3.8 05/26/2015 1149   K 3.8 05/05/2015 1402   CL 104 06/15/2015 1136   CL 103 05/05/2015 1402   CO2 27 06/15/2015 1136   CO2 25 05/26/2015 1149   CO2 25 05/05/2015 1402   BUN 12 06/15/2015 1136   BUN 9.0 05/26/2015 1149   BUN 12 05/05/2015 1402   CREATININE 0.9 06/15/2015 1136   CREATININE 0.8 05/26/2015 1149   CREATININE 0.85 05/05/2015 1402      Component Value Date/Time   CALCIUM 8.1 06/15/2015 1136   CALCIUM 8.3* 05/26/2015 1149   CALCIUM 7.7* 05/05/2015 1402   ALKPHOS 52 06/15/2015 1136   ALKPHOS 59 05/26/2015 1149   ALKPHOS 68 05/05/2015 1402   AST 18 06/15/2015 1136   AST 15 05/26/2015 1149   AST 16 05/05/2015 1402   ALT 14 06/15/2015 1136   ALT <9 05/26/2015 1149   ALT 11 05/05/2015 1402   BILITOT 0.80 06/15/2015 1136   BILITOT 0.44 05/26/2015 1149   BILITOT 0.5 05/05/2015 1402     Impression and Plan: Andrew [name] is 82 year old white male with both myelodysplasia and hemochromatosis. He is feeling much better. His appetite and energy have both improved since he started on the Decadron. His weight is up 2 lbs today and he has been getting up to walk for exercise at home.  His Hgb today is 10.7 so we will proceed with a dose of Aranesp.   He is not receiving phlebotomies at this time as his quality of life seemed to decline afterwards and his ferritin was not responding.  We will plan to see him back in 1 month for labs and follow-up.   He will contact us with any questions or concerns. We can certainly see him sooner if need be.   Eliezer Bottom, NP 1/25/201712:58 PM

## 2015-06-28 NOTE — Patient Instructions (Signed)
Darbepoetin Alfa injection What is this medicine? DARBEPOETIN ALFA (dar be POE e tin AL fa) helps your body make more red blood cells. It is used to treat anemia caused by chronic kidney failure and chemotherapy. This medicine may be used for other purposes; ask your health care provider or pharmacist if you have questions. What should I tell my health care provider before I take this medicine? They need to know if you have any of these conditions: -blood clotting disorders or history of blood clots -cancer patient not on chemotherapy -cystic fibrosis -heart disease, such as angina, heart failure, or a history of a heart attack -hemoglobin level of 12 g/dL or greater -high blood pressure -low levels of folate, iron, or vitamin B12 -seizures -an unusual or allergic reaction to darbepoetin, erythropoietin, albumin, hamster proteins, latex, other medicines, foods, dyes, or preservatives -pregnant or trying to get pregnant -breast-feeding How should I use this medicine? This medicine is for injection into a vein or under the skin. It is usually given by a health care professional in a hospital or clinic setting. If you get this medicine at home, you will be taught how to prepare and give this medicine. Do not shake the solution before you withdraw a dose. Use exactly as directed. Take your medicine at regular intervals. Do not take your medicine more often than directed. It is important that you put your used needles and syringes in a special sharps container. Do not put them in a trash can. If you do not have a sharps container, call your pharmacist or healthcare provider to get one. Talk to your pediatrician regarding the use of this medicine in children. While this medicine may be used in children as young as 1 year for selected conditions, precautions do apply. Overdosage: If you think you have taken too much of this medicine contact a poison control center or emergency room at once. NOTE:  This medicine is only for you. Do not share this medicine with others. What if I miss a dose? If you miss a dose, take it as soon as you can. If it is almost time for your next dose, take only that dose. Do not take double or extra doses. What may interact with this medicine? Do not take this medicine with any of the following medications: -epoetin alfa This list may not describe all possible interactions. Give your health care provider a list of all the medicines, herbs, non-prescription drugs, or dietary supplements you use. Also tell them if you smoke, drink alcohol, or use illegal drugs. Some items may interact with your medicine. What should I watch for while using this medicine? Visit your prescriber or health care professional for regular checks on your progress and for the needed blood tests and blood pressure measurements. It is especially important for the doctor to make sure your hemoglobin level is in the desired range, to limit the risk of potential side effects and to give you the best benefit. Keep all appointments for any recommended tests. Check your blood pressure as directed. Ask your doctor what your blood pressure should be and when you should contact him or her. As your body makes more red blood cells, you may need to take iron, folic acid, or vitamin B supplements. Ask your doctor or health care provider which products are right for you. If you have kidney disease continue dietary restrictions, even though this medication can make you feel better. Talk with your doctor or health care professional about the   foods you eat and the vitamins that you take. What side effects may I notice from receiving this medicine? Side effects that you should report to your doctor or health care professional as soon as possible: -allergic reactions like skin rash, itching or hives, swelling of the face, lips, or tongue -breathing problems -changes in vision -chest pain -confusion, trouble speaking  or understanding -feeling faint or lightheaded, falls -high blood pressure -muscle aches or pains -pain, swelling, warmth in the leg -rapid weight gain -severe headaches -sudden numbness or weakness of the face, arm or leg -trouble walking, dizziness, loss of balance or coordination -seizures (convulsions) -swelling of the ankles, feet, hands -unusually weak or tired Side effects that usually do not require medical attention (report to your doctor or health care professional if they continue or are bothersome): -diarrhea -fever, chills (flu-like symptoms) -headaches -nausea, vomiting -redness, stinging, or swelling at site where injected This list may not describe all possible side effects. Call your doctor for medical advice about side effects. You may report side effects to FDA at 1-800-FDA-1088. Where should I keep my medicine? Keep out of the reach of children. Store in a refrigerator between 2 and 8 degrees C (36 and 46 degrees F). Do not freeze. Do not shake. Throw away any unused portion if using a single-dose vial. Throw away any unused medicine after the expiration date. NOTE: This sheet is a summary. It may not cover all possible information. If you have questions about this medicine, talk to your doctor, pharmacist, or health care provider.    2016, [employer]. (2008-05-03 10:23:57)  

## 2015-06-29 LAB — PREALBUMIN: PREALBUMIN: 26 mg/dL (ref 9–32)

## 2015-06-29 LAB — RETICULOCYTES: Reticulocyte Count: 3.4 % — ABNORMAL HIGH (ref 0.6–2.6)

## 2015-07-13 MED FILL — predniSONE 20 MG TABS: 20 | 30 days supply | Qty: 30 | Fill #1

## 2015-08-04 ENCOUNTER — Ambulatory Visit (HOSPITAL_BASED_OUTPATIENT_CLINIC_OR_DEPARTMENT_OTHER): Payer: Medicare Other | Admitting: Hematology & Oncology

## 2015-08-04 ENCOUNTER — Ambulatory Visit (HOSPITAL_BASED_OUTPATIENT_CLINIC_OR_DEPARTMENT_OTHER): Payer: Medicare Other

## 2015-08-04 ENCOUNTER — Other Ambulatory Visit (HOSPITAL_BASED_OUTPATIENT_CLINIC_OR_DEPARTMENT_OTHER): Payer: Medicare Other

## 2015-08-04 DIAGNOSIS — D4620 Refractory anemia with excess of blasts, unspecified: Secondary | ICD-10-CM

## 2015-08-04 DIAGNOSIS — D46Z Other myelodysplastic syndromes: Secondary | ICD-10-CM

## 2015-08-04 LAB — CBC WITH DIFFERENTIAL (CANCER CENTER ONLY)
HEMATOCRIT: 33.9 % — AB (ref 38.7–49.9)
HEMOGLOBIN: 10.7 g/dL — AB (ref 13.0–17.1)
MCH: 34.7 pg — AB (ref 28.0–33.4)
MCHC: 31.6 g/dL — ABNORMAL LOW (ref 32.0–35.9)
MCV: 110 fL — AB (ref 82–98)
Platelets: 142 10*3/uL — ABNORMAL LOW (ref 145–400)
RBC: 3.08 10*6/uL — AB (ref 4.20–5.70)
RDW: 23.7 % — ABNORMAL HIGH (ref 11.1–15.7)
WBC: 9.1 10*3/uL (ref 4.0–10.0)

## 2015-08-04 LAB — CMP (CANCER CENTER ONLY)
ALBUMIN: 3.1 g/dL — AB (ref 3.3–5.5)
ALT: 25 U/L (ref 10–47)
AST: 24 U/L (ref 11–38)
Alkaline Phosphatase: 66 U/L (ref 26–84)
BILIRUBIN TOTAL: 1 mg/dL (ref 0.20–1.60)
BUN: 17 mg/dL (ref 7–22)
CO2: 29 mEq/L (ref 18–33)
Calcium: 8.5 mg/dL (ref 8.0–10.3)
Chloride: 104 mEq/L (ref 98–108)
Creat: 1 mg/dl (ref 0.6–1.2)
Glucose, Bld: 123 mg/dL — ABNORMAL HIGH (ref 73–118)
Potassium: 3.6 mEq/L (ref 3.3–4.7)
Sodium: 143 mEq/L (ref 128–145)
TOTAL PROTEIN: 7.9 g/dL (ref 6.4–8.1)

## 2015-08-04 LAB — MANUAL DIFFERENTIAL (CHCC SATELLITE)
ALC: 2.7 10*3/uL (ref 0.9–3.3)
ANC (CHCC MAN DIFF): 4.9 10*3/uL (ref 1.5–6.5)
Band Neutrophils: 3 % (ref 0–10)
LYMPH: 30 % (ref 14–48)
MONO: 16 % — ABNORMAL HIGH (ref 0–13)
Metamyelocytes: 2 % — ABNORMAL HIGH (ref 0–0)
PLT EST ~~LOC~~: DECREASED
SEG: 49 % (ref 40–75)
nRBC: 2 % — ABNORMAL HIGH (ref 0–0)

## 2015-08-04 LAB — IRON AND TIBC
%SAT: >100 % (ref 20–?)
IRON: 173 ug/dL — AB (ref 42–163)
TIBC: 171 ug/dL — AB (ref 202–409)

## 2015-08-04 LAB — FERRITIN: Ferritin: 1860 ng/ml — ABNORMAL HIGH (ref 22–316)

## 2015-08-04 MED ORDER — DARBEPOETIN ALFA 300 MCG/0.6ML IJ SOSY
PREFILLED_SYRINGE | INTRAMUSCULAR | Status: AC
  Filled 2015-08-04: qty 0.6

## 2015-08-04 MED ORDER — DARBEPOETIN ALFA 300 MCG/0.6ML IJ SOSY
300.0000 ug | PREFILLED_SYRINGE | INTRAMUSCULAR | Status: DC
  Administered 2015-08-04: 300 ug via SUBCUTANEOUS

## 2015-08-04 NOTE — Patient Instructions (Signed)
Darbepoetin Alfa injection What is this medicine? DARBEPOETIN ALFA (dar be POE e tin AL fa) helps your body make more red blood cells. It is used to treat anemia caused by chronic kidney failure and chemotherapy. This medicine may be used for other purposes; ask your health care provider or pharmacist if you have questions. What should I tell my health care provider before I take this medicine? They need to know if you have any of these conditions: -blood clotting disorders or history of blood clots -cancer patient not on chemotherapy -cystic fibrosis -heart disease, such as angina, heart failure, or a history of a heart attack -hemoglobin level of 12 g/dL or greater -high blood pressure -low levels of folate, iron, or vitamin B12 -seizures -an unusual or allergic reaction to darbepoetin, erythropoietin, albumin, hamster proteins, latex, other medicines, foods, dyes, or preservatives -pregnant or trying to get pregnant -breast-feeding How should I use this medicine? This medicine is for injection into a vein or under the skin. It is usually given by a health care professional in a hospital or clinic setting. If you get this medicine at home, you will be taught how to prepare and give this medicine. Do not shake the solution before you withdraw a dose. Use exactly as directed. Take your medicine at regular intervals. Do not take your medicine more often than directed. It is important that you put your used needles and syringes in a special sharps container. Do not put them in a trash can. If you do not have a sharps container, call your pharmacist or healthcare provider to get one. Talk to your pediatrician regarding the use of this medicine in children. While this medicine may be used in children as young as 1 year for selected conditions, precautions do apply. Overdosage: If you think you have taken too much of this medicine contact a poison control center or emergency room at once. NOTE:  This medicine is only for you. Do not share this medicine with others. What if I miss a dose? If you miss a dose, take it as soon as you can. If it is almost time for your next dose, take only that dose. Do not take double or extra doses. What may interact with this medicine? Do not take this medicine with any of the following medications: -epoetin alfa This list may not describe all possible interactions. Give your health care provider a list of all the medicines, herbs, non-prescription drugs, or dietary supplements you use. Also tell them if you smoke, drink alcohol, or use illegal drugs. Some items may interact with your medicine. What should I watch for while using this medicine? Visit your prescriber or health care professional for regular checks on your progress and for the needed blood tests and blood pressure measurements. It is especially important for the doctor to make sure your hemoglobin level is in the desired range, to limit the risk of potential side effects and to give you the best benefit. Keep all appointments for any recommended tests. Check your blood pressure as directed. Ask your doctor what your blood pressure should be and when you should contact him or her. As your body makes more red blood cells, you may need to take iron, folic acid, or vitamin B supplements. Ask your doctor or health care provider which products are right for you. If you have kidney disease continue dietary restrictions, even though this medication can make you feel better. Talk with your doctor or health care professional about the   foods you eat and the vitamins that you take. What side effects may I notice from receiving this medicine? Side effects that you should report to your doctor or health care professional as soon as possible: -allergic reactions like skin rash, itching or hives, swelling of the face, lips, or tongue -breathing problems -changes in vision -chest pain -confusion, trouble speaking  or understanding -feeling faint or lightheaded, falls -high blood pressure -muscle aches or pains -pain, swelling, warmth in the leg -rapid weight gain -severe headaches -sudden numbness or weakness of the face, arm or leg -trouble walking, dizziness, loss of balance or coordination -seizures (convulsions) -swelling of the ankles, feet, hands -unusually weak or tired Side effects that usually do not require medical attention (report to your doctor or health care professional if they continue or are bothersome): -diarrhea -fever, chills (flu-like symptoms) -headaches -nausea, vomiting -redness, stinging, or swelling at site where injected This list may not describe all possible side effects. Call your doctor for medical advice about side effects. You may report side effects to FDA at 1-800-FDA-1088. Where should I keep my medicine? Keep out of the reach of children. Store in a refrigerator between 2 and 8 degrees C (36 and 46 degrees F). Do not freeze. Do not shake. Throw away any unused portion if using a single-dose vial. Throw away any unused medicine after the expiration date. NOTE: This sheet is a summary. It may not cover all possible information. If you have questions about this medicine, talk to your doctor, pharmacist, or health care provider.    2016, [employer]. (2008-05-03 10:23:57)  

## 2015-08-04 NOTE — Progress Notes (Signed)
Hematology and Oncology Follow Up Visit  Andrew [name] [id number] 05-Jun-1932 82 y.o. 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 is back for followup. This is the best I have seen him look. He says he's feeling well. Maybe, the prednisone that he is on is helping. He has a much better appetite. His weight is up 17 pounds since we last saw him.  His iron levels are still on the high side. His last ferritin was 1573. His iron saturation was over 100%. His total iron was 144.  He's had no nausea or vomiting issues. He's had no problems with his bowels. His bowels are moving better now.  He's had no leg swelling. He's had no bleeding. He's had no pain. He's had no cough.  Overall , his performance status is ECOG 2     Medications:  Current outpatient prescriptions:  .  doxazosin (CARDURA) 4 MG tablet, Take 4 mg by mouth at bedtime. , Disp: , Rfl:  .  finasteride (PROSCAR) 5 MG tablet, Take 5 mg by mouth every morning. , Disp: , Rfl:  .  KLOR-CON M20 20 MEQ tablet, Take 20 mEq by mouth daily., Disp: , Rfl: 3 .  levothyroxine (SYNTHROID) 137 MCG tablet, Take 137 mcg by mouth daily before breakfast., Disp: , Rfl:  .  lovastatin (MEVACOR) 20 MG tablet, Take 20 mg by mouth at bedtime. , Disp: , Rfl:  .  Polyvinyl Alcohol-Povidone (REFRESH OP), Place 1 drop into both eyes daily as needed (dry eyes)., Disp: , Rfl:  .  predniSONE (DELTASONE) 20 MG tablet, Take 1 tablet (20 mg total) by mouth daily with breakfast., Disp: 30 tablet, Rfl: 3 No current facility-administered medications for this visit.  Facility-Administered Medications Ordered in Other Visits:  .  Darbepoetin Alfa (ARANESP) injection 300 mcg, 300 mcg, Subcutaneous,  Q14 Days, Eliezer Bottom, NP .  Influenza vac split quadrivalent PF (FLUARIX) injection 0.5 mL, 0.5 mL, Intramuscular, Once, Volanda Napoleon, MD, 0.5 mL at 04/07/15 1604  Allergies:  Allergies  Allergen Reactions  . Nitrofuran Derivatives Other (See Comments)    ?    Past Medical History, Surgical history, Social history, and Family History were reviewed and updated.  Review of Systems: As above  Physical Exam:  weight is 184 lb (83.462 kg). His oral temperature is 98 F (36.7 C). His blood pressure is 98/44 and his pulse is 78.   Elderly white male who is somewhat thin. He looks like he is in chronic distress.ead and neck exam shows no ocular or oral lesions. There are no palpable cervical or supraclavicular lymph nodes. Lungs are clear. Cardiac exam regular rate and rhythm with no murmurs, rubs or bruits.. Abdomen is soft. He has good bowel sounds. There is no fluid wave. There is a well-healed laparotomy scar from his aneurysm repair. There is no palpable liver or spleen tip.  rectal exam shows some external hemorrhoids. No masses are noted in the rectal vault. Stool is heme-negative. Extremities shows no clubbing, cyanosis or edema. Skin exam no rashes. He has some scattered ecchymoses. Neurological exam shows some slight decrease in sensation in his legs.  Lab Results  Component Value Date   WBC 9.1 08/04/2015   HGB  10.7* 08/04/2015   HCT 33.9* 08/04/2015   MCV 110* 08/04/2015   PLT 142* 08/04/2015     Chemistry      Component Value Date/Time   NA 143 08/04/2015 1043   NA 137 06/28/2015 1056   NA 133* 05/05/2015 1402   K 3.6 08/04/2015 1043   K 4.0 06/28/2015 1056   K 3.8 05/05/2015 1402   CL 104 08/04/2015 1043   CL 103 05/05/2015 1402   CO2 29 08/04/2015 1043   CO2 26 06/28/2015 1056   CO2 25 05/05/2015 1402   BUN 17 08/04/2015 1043   BUN 17.1 06/28/2015 1056   BUN 12 05/05/2015 1402   CREATININE 1.0 08/04/2015 1043   CREATININE 0.9 06/28/2015 1056    CREATININE 0.85 05/05/2015 1402      Component Value Date/Time   CALCIUM 8.5 08/04/2015 1043   CALCIUM 8.9 06/28/2015 1056   CALCIUM 7.7* 05/05/2015 1402   ALKPHOS 66 08/04/2015 1043   ALKPHOS 73 06/28/2015 1056   ALKPHOS 68 05/05/2015 1402   AST 24 08/04/2015 1043   AST 18 06/28/2015 1056   AST 16 05/05/2015 1402   ALT 25 08/04/2015 1043   ALT 16 06/28/2015 1056   ALT 11 05/05/2015 1402   BILITOT 1.00 08/04/2015 1043   BILITOT 0.74 06/28/2015 1056   BILITOT 0.5 05/05/2015 1402         Impression and Plan: Andrew [name] is 82 year old white male. He has both myelodysplasia and hemochromatosis. He has an incredible amount of iron. This is probably due to hemachromatosis in addition to ineffective erythropoiesis.  This is the best I have seen him look. I'm just happy that he is looking better and feeling better. His quality of life is doing well.  For now, we'll go ahead and give him a dose of Aranesp. I really think this is helping. This allows [place] to not have to transfuse him. Perhaps, this will help with some of the excess iron burden.  We will plan to get him back to see [place] in another month.  I spent about 35 minutes with he and his wife today.   Volanda Napoleon, MD 3/3/201711:01 AM

## 2015-08-05 LAB — RETICULOCYTES: Reticulocyte Count: 3.7 % — ABNORMAL HIGH (ref 0.6–2.6)

## 2015-08-05 LAB — PREALBUMIN: PREALBUMIN: 31 mg/dL (ref 9–32)

## 2015-08-14 MED FILL — predniSONE 20 MG TABS: 20 | 30 days supply | Qty: 30 | Fill #2

## 2015-09-08 ENCOUNTER — Encounter: Payer: Self-pay | Admitting: Family

## 2015-09-08 ENCOUNTER — Other Ambulatory Visit (HOSPITAL_BASED_OUTPATIENT_CLINIC_OR_DEPARTMENT_OTHER): Payer: Medicare Other

## 2015-09-08 ENCOUNTER — Ambulatory Visit (HOSPITAL_BASED_OUTPATIENT_CLINIC_OR_DEPARTMENT_OTHER): Payer: Medicare Other | Admitting: Family

## 2015-09-08 ENCOUNTER — Ambulatory Visit: Payer: Medicare Other

## 2015-09-08 VITALS — BP 97/49 | HR 84 | Temp 97.4°F | Resp 14 | Ht 69.0 in | Wt 187.0 lb

## 2015-09-08 DIAGNOSIS — D4620 Refractory anemia with excess of blasts, unspecified: Secondary | ICD-10-CM | POA: Diagnosis not present

## 2015-09-08 DIAGNOSIS — D510 Vitamin B12 deficiency anemia due to intrinsic factor deficiency: Secondary | ICD-10-CM | POA: Diagnosis not present

## 2015-09-08 DIAGNOSIS — F418 Other specified anxiety disorders: Secondary | ICD-10-CM

## 2015-09-08 DIAGNOSIS — F329 Major depressive disorder, single episode, unspecified: Secondary | ICD-10-CM

## 2015-09-08 DIAGNOSIS — F419 Anxiety disorder, unspecified: Principal | ICD-10-CM

## 2015-09-08 LAB — MANUAL DIFFERENTIAL (CHCC SATELLITE)
ALC: 3.3 10*3/uL (ref 0.9–3.3)
ANC (CHCC HP manual diff): 6.3 10*3/uL (ref 1.5–6.5)
Band Neutrophils: 1 % (ref 0–10)
LYMPH: 31 % (ref 14–48)
MONO: 10 % (ref 0–13)
Myelocytes: 1 % — ABNORMAL HIGH (ref 0–0)
PLT EST ~~LOC~~: ADEQUATE
SEG: 57 % (ref 40–75)
nRBC: 1 % — ABNORMAL HIGH (ref 0–0)

## 2015-09-08 LAB — COMPREHENSIVE METABOLIC PANEL
ALT: 13 U/L (ref 0–55)
ANION GAP: 10 meq/L (ref 3–11)
AST: 18 U/L (ref 5–34)
Albumin: 3.4 g/dL — ABNORMAL LOW (ref 3.5–5.0)
Alkaline Phosphatase: 58 U/L (ref 40–150)
BUN: 17.5 mg/dL (ref 7.0–26.0)
CHLORIDE: 109 meq/L (ref 98–109)
CO2: 24 meq/L (ref 22–29)
CREATININE: 0.9 mg/dL (ref 0.7–1.3)
Calcium: 9 mg/dL (ref 8.4–10.4)
EGFR: 78 mL/min/{1.73_m2} — AB (ref >90)
Glucose: 90 mg/dl (ref 70–140)
Potassium: 3.4 mEq/L — ABNORMAL LOW (ref 3.5–5.1)
Sodium: 142 mEq/L (ref 136–145)
Total Bilirubin: 0.7 mg/dL (ref 0.20–1.20)
Total Protein: 8.1 g/dL (ref 6.4–8.3)

## 2015-09-08 LAB — IRON AND TIBC
%SAT: 98 % — AB (ref 20–55)
Iron: 167 ug/dL — ABNORMAL HIGH (ref 42–163)
TIBC: 171 ug/dL — ABNORMAL LOW (ref 202–409)
UIBC: 4 ug/dL — AB (ref 117–376)

## 2015-09-08 LAB — CBC WITH DIFFERENTIAL (CANCER CENTER ONLY)
HEMATOCRIT: 35.8 % — AB (ref 38.7–49.9)
HEMOGLOBIN: 11.5 g/dL — AB (ref 13.0–17.1)
MCH: 37.1 pg — AB (ref 28.0–33.4)
MCHC: 32.1 g/dL (ref 32.0–35.9)
MCV: 116 fL — ABNORMAL HIGH (ref 82–98)
Platelets: 174 10*3/uL (ref 145–400)
RBC: 3.1 10*6/uL — AB (ref 4.20–5.70)
RDW: 20.6 % — ABNORMAL HIGH (ref 11.1–15.7)
WBC: 10.7 10*3/uL — ABNORMAL HIGH (ref 4.0–10.0)

## 2015-09-08 LAB — FERRITIN

## 2015-09-08 MED ORDER — ALPRAZOLAM 0.25 MG PO TABS: 0.2500 mg | ORAL_TABLET | Freq: Two times a day (BID) | ORAL | Status: DC | PRN

## 2015-09-08 MED ORDER — DARBEPOETIN ALFA 300 MCG/0.6ML IJ SOSY
PREFILLED_SYRINGE | INTRAMUSCULAR | Status: AC
Start: 2015-09-08 — End: 2015-09-08
  Filled 2015-09-08: qty 0.6

## 2015-09-08 MED ORDER — LOVASTATIN 20 MG PO TABS
20.0000 mg | ORAL_TABLET | Freq: Every day | ORAL | Status: AC
Start: 2015-09-08 — End: ?

## 2015-09-08 MED ORDER — ESCITALOPRAM OXALATE 10 MG PO TABS: 10.0000 mg | ORAL_TABLET | Freq: Every day | ORAL | Status: DC

## 2015-09-08 MED FILL — ESCITALOPRAM 10 MG TABLET: 10 | 30 days supply | Qty: 30 | Fill #0

## 2015-09-08 MED FILL — ALPRAZolam 0.25 MG TABS: 0.25 | 30 days supply | Qty: 60 | Fill #0

## 2015-09-08 NOTE — Progress Notes (Signed)
Aranesp held for hgb 11.5.

## 2015-09-08 NOTE — Progress Notes (Signed)
Hematology and Oncology Follow Up Visit  YAIR REHOR [id number] 1933/04/19 82 y.o. 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 They are both quite tearful. The patient states that he is feeling very depressed because he doesn't feel that he is getting better. His wife states that he is having a hard time not being able to fix repairs around the house and get out in the yard like he used to. He is asking for something to help with this. He denies feelings of self harm or harming others.  He also complains of leg pain. He has tried stopping his Mevacor but this made no difference. I had discontinued his med but he prefers to stay on it so I have reordered his same prescription.  He is able to ambulate at home with assistance and also uses a walker or cane. He has had no falls or syncopal episodes.  He has no swelling in his extremities. The numbness and tingling in his hands and feet has not changed.  No fever, chills, n/v, cough, rash, headache, dizziness, blurred vision, SOB, chest pain, palpitations, abdominal pain or changes in bladder habits.  He has a good appetite and is staying well hydrated. His weight is stable.   Medications:    Medication List       This list is accurate as of: 09/08/15 10:41 AM.  Always use your most recent med list.               doxazosin 4 MG tablet  Commonly known as:  CARDURA  Take 4 mg by mouth at bedtime.     finasteride 5 MG tablet  Commonly known as:  PROSCAR  Take 5 mg by mouth every morning.     KLOR-CON M20 20 MEQ tablet  Generic drug:  potassium chloride SA  Take 20 mEq by mouth daily.     levothyroxine 125 MCG tablet   Commonly known as:  SYNTHROID, LEVOTHROID  Take 125 mcg by mouth daily before breakfast.     lovastatin 20 MG tablet  Commonly known as:  MEVACOR  Take 20 mg by mouth at bedtime.     predniSONE 20 MG tablet  Commonly known as:  DELTASONE  Take 1 tablet (20 mg total) by mouth daily with breakfast.     REFRESH OP  Place 1 drop into both eyes daily as needed (dry eyes).        Allergies:  Allergies  Allergen Reactions  . Nitrofuran Derivatives Other (See Comments)    ?    Past Medical History, Surgical history, Social history, and Family History were reviewed and updated.  Review of Systems: All other 10 point review of systems is negative.   Physical Exam:  height is 5\' 9"  (1.753 m) and weight is 187 lb (84.823 kg). His oral temperature is 97.4 F (36.3 C). His blood pressure is 97/49 and his pulse is 84. His respiration is 14.   Wt Readings from Last 3 Encounters:  09/08/15 187 lb (84.823 kg)  08/04/15 184 lb (83.462 kg)  06/28/15 167 lb (75.751 kg)    Ocular: Sclerae unicteric, pupils equal, round and reactive to light Ear-nose-throat: Oropharynx clear,  dentition fair Lymphatic: No cervical supraclavicular or axillary adenopathy Lungs no rales or rhonchi, good excursion bilaterally Heart regular rate and rhythm, no murmur appreciated Abd soft, nontender, positive bowel sounds, no liver or spleen tip palpated on exam, no fluid wave MSK no focal spinal tenderness, no joint edema Neuro: non-focal, well-oriented, appropriate affect Breast: Deferred  Lab Results  Component Value Date   WBC 9.1 08/04/2015   HGB 10.7* 08/04/2015   HCT 33.9* 08/04/2015   MCV 110* 08/04/2015   PLT 142* 08/04/2015   Lab Results  Component Value Date   FERRITIN 1,860* 08/04/2015   IRON 173* 08/04/2015   TIBC 171* 08/04/2015   UIBC <1.0 08/04/2015   IRONPCTSAT >100 08/04/2015   Lab Results  Component Value Date   RETICCTPCT 2.3 05/26/2015   RBC 3.08* 08/04/2015   RETICCTABS  67.9 05/26/2015   No results found for: KPAFRELGTCHN, LAMBDASER, KAPLAMBRATIO No results found for: IGGSERUM, IGA, IGMSERUM No results found for: Odetta Pink, SPEI   Chemistry      Component Value Date/Time   NA 143 08/04/2015 1043   NA 137 06/28/2015 1056   NA 133* 05/05/2015 1402   K 3.6 08/04/2015 1043   K 4.0 06/28/2015 1056   K 3.8 05/05/2015 1402   CL 104 08/04/2015 1043   CL 103 05/05/2015 1402   CO2 29 08/04/2015 1043   CO2 26 06/28/2015 1056   CO2 25 05/05/2015 1402   BUN 17 08/04/2015 1043   BUN 17.1 06/28/2015 1056   BUN 12 05/05/2015 1402   CREATININE 1.0 08/04/2015 1043   CREATININE 0.9 06/28/2015 1056   CREATININE 0.85 05/05/2015 1402      Component Value Date/Time   CALCIUM 8.5 08/04/2015 1043   CALCIUM 8.9 06/28/2015 1056   CALCIUM 7.7* 05/05/2015 1402   ALKPHOS 66 08/04/2015 1043   ALKPHOS 73 06/28/2015 1056   ALKPHOS 68 05/05/2015 1402   AST 24 08/04/2015 1043   AST 18 06/28/2015 1056   AST 16 05/05/2015 1402   ALT 25 08/04/2015 1043   ALT 16 06/28/2015 1056   ALT 11 05/05/2015 1402   BILITOT 1.00 08/04/2015 1043   BILITOT 0.74 06/28/2015 1056   BILITOT 0.5 05/05/2015 1402     Impression and Plan: Mr. Scriver is 82 year old white male with both myelodysplasia and hemochromatosis. His main issue at this time is clearly his anxiety and depression. He is tearful and wants to move out of his home and be alone. We will try him on Lexapro 10 mg PO daily and then Xanax 0.25 mg PO BID PRN for anxiety. He is agreeable to this.  We will call and check on him in 1 month to see how he is feeling.  His Hgb at this time is 11.5 with an MCV of 116. He will not need Aranesp at this time.  He is not receiving phlebotomies at this time as his quality of life seemed to decline afterwards and his ferritin was not responding.  We will plan to see him back in 6 months per his adamant request. He states that he is  tired of having to see multiple doctors and needs a break.  Both he and his wife know to contact us if he becomes symptomatic with anemia and with any questions or concerns. We can certainly see him sooner if need be.   Eliezer Bottom, NP 4/7/201710:41 AM

## 2015-09-09 LAB — RETICULOCYTES: RETICULOCYTE COUNT: 3.9 % — AB (ref 0.6–2.6)

## 2015-09-15 MED FILL — predniSONE 20 MG TABS: 20 | 30 days supply | Qty: 30 | Fill #3

## 2015-10-06 ENCOUNTER — Other Ambulatory Visit: Payer: Self-pay | Admitting: Family

## 2015-10-06 DIAGNOSIS — D4620 Refractory anemia with excess of blasts, unspecified: Secondary | ICD-10-CM

## 2015-10-06 DIAGNOSIS — F32A Depression, unspecified: Secondary | ICD-10-CM

## 2015-10-06 DIAGNOSIS — F419 Anxiety disorder, unspecified: Principal | ICD-10-CM

## 2015-10-06 DIAGNOSIS — D510 Vitamin B12 deficiency anemia due to intrinsic factor deficiency: Secondary | ICD-10-CM

## 2015-10-06 DIAGNOSIS — F329 Major depressive disorder, single episode, unspecified: Secondary | ICD-10-CM

## 2015-10-06 MED ORDER — ALPRAZOLAM 0.25 MG PO TABS: 0.2500 mg | ORAL_TABLET | Freq: Two times a day (BID) | ORAL | Status: DC | PRN

## 2015-10-06 MED FILL — ESCITALOPRAM 10 MG TABLET: 10 | 30 days supply | Qty: 30 | Fill #1

## 2015-10-06 MED FILL — ALPRAZolam 0.25 MG TABS: 0.25 | 30 days supply | Qty: 60 | Fill #0

## 2015-10-18 ENCOUNTER — Other Ambulatory Visit: Payer: Self-pay | Admitting: Family

## 2015-10-18 MED FILL — predniSONE 20 MG TABS: 20 | 30 days supply | Qty: 30 | Fill #0

## 2015-11-07 ENCOUNTER — Other Ambulatory Visit: Payer: Self-pay | Admitting: *Deleted

## 2015-11-07 DIAGNOSIS — D510 Vitamin B12 deficiency anemia due to intrinsic factor deficiency: Secondary | ICD-10-CM

## 2015-11-07 DIAGNOSIS — F419 Anxiety disorder, unspecified: Principal | ICD-10-CM

## 2015-11-07 DIAGNOSIS — F329 Major depressive disorder, single episode, unspecified: Secondary | ICD-10-CM

## 2015-11-07 DIAGNOSIS — D4620 Refractory anemia with excess of blasts, unspecified: Secondary | ICD-10-CM

## 2015-11-07 MED ORDER — ALPRAZOLAM 0.25 MG PO TABS: 0.2500 mg | ORAL_TABLET | Freq: Two times a day (BID) | ORAL | Status: DC | PRN

## 2015-11-07 MED FILL — ALPRAZolam 0.25 MG TABS: 0.25 | 30 days supply | Qty: 60 | Fill #0

## 2015-11-09 ENCOUNTER — Encounter: Payer: Self-pay | Admitting: Vascular Surgery

## 2015-11-14 ENCOUNTER — Ambulatory Visit (INDEPENDENT_AMBULATORY_CARE_PROVIDER_SITE_OTHER): Payer: Medicare Other | Admitting: Family

## 2015-11-14 ENCOUNTER — Ambulatory Visit (HOSPITAL_COMMUNITY)
Admission: RE | Admit: 2015-11-14 | Discharge: 2015-11-14 | Disposition: A | Discharge status: Home / Self Care | Payer: Medicare Other | Source: Ambulatory Visit | Attending: Vascular Surgery | Admitting: Vascular Surgery

## 2015-11-14 ENCOUNTER — Encounter: Payer: Self-pay | Admitting: Family

## 2015-11-14 VITALS — BP 103/61 | HR 69 | Ht 69.0 in | Wt 196.4 lb

## 2015-11-14 DIAGNOSIS — F329 Major depressive disorder, single episode, unspecified: Secondary | ICD-10-CM | POA: Insufficient documentation

## 2015-11-14 DIAGNOSIS — E785 Hyperlipidemia, unspecified: Secondary | ICD-10-CM | POA: Diagnosis not present

## 2015-11-14 DIAGNOSIS — Z95828 Presence of other vascular implants and grafts: Secondary | ICD-10-CM | POA: Diagnosis not present

## 2015-11-14 DIAGNOSIS — I714 Abdominal aortic aneurysm, without rupture, unspecified: Secondary | ICD-10-CM

## 2015-11-14 DIAGNOSIS — Z48812 Encounter for surgical aftercare following surgery on the circulatory system: Secondary | ICD-10-CM

## 2015-11-14 NOTE — Progress Notes (Signed)
[facility] [place]  CC: Follow up s/p EVAR  History of Present Illness  Andrew [name] is a 82 y.o. (1932/06/04) male patient of Dr. Donnetta Hutching who returns today for follow-up of his stent graft repair of abdominal aortic aneurysm November 2015. He did well, was discharged home on postoperative day 1. He continues to have a significant disability related to severe degenerative disc disease and weakness related to this.  He is here today with his wife. He reports no new medical difficulties. Dr. Donnetta Hutching last saw pt on 11/08/14. At that time CT scan was reviewed with the patient. This showed stable stent graft size at 6 cm. No evidence of shift of the device. Did have a stable type II endoleak.  The patient denies back or abdominal pain currently.  Pt denies any known hx of stroke or TIA.   Wife states pt has been taking prednisone since about January 2017, states he is taking it to gain weight and have more energy, has purpuric areas on forearms.    Pt Diabetic: No Pt smoker: former smoker, quit in 1987, started in 1962   Past Medical History  Diagnosis Date  . Hereditary hemochromatosis 12/24/2013  oncologist/  hemotologist--  dr Marin Olp    (homozygous for C282Y mutation)    . Hypothyroidism   . Depression   . S/P AAA repair     was 5.8cc  repair 04-08-2014  endovascular stent graft  . Peripheral vascular disease ([place])   . Self-catheterizes urinary bladder   . Urethral stricture   . Aneurysm of internal iliac artery (HCC)     right 14 mm per ct (monitored by dr early)  . MDS (myelodysplastic syndrome), low grade ([place]) 12/24/2013    ONCOLOGIST--  DR Marin Olp--  tx  has had phlebotomy and get aranesp  . Refractory anemia with multi-lineage dysplasia [facility]) oncologis/ hemotologist-  dr Marin Olp    Trisomy 8  . Hyperlipidemia   . Wears glasses   . Wears hearing aid     bilateral  . Wears partial dentures     upper and lower  . Foot pain     while lying flat  .  Neurogenic bladder   . Shingles     L eye   Past Surgical History  Procedure Laterality Date  . Abdominal aortic endovascular stent graft N/A 04/08/2014    Procedure: ABDOMINAL AORTIC ENDOVASCULAR STENT GRAFT;  Surgeon: Rosetta Posner, MD;  Location: [facility];  Service: Vascular;  Laterality: N/A;  . Laminectomy thoracic fusion posterior  04-16-2006    laminectomy T12,  fusion T10 -- L2  . Cataract extraction w/ intraocular lens  implant, bilateral  2013  . Inguinal hernia repair  yrs ago  . Cystoscopy with retrograde urethrogram N/A 05/11/2015    Procedure: CYSTO WITH URETHROGRAM;  Surgeon: Bjorn Loser, MD;  Location: [facility];  Service: Urology;  Laterality: N/A;  . Balloon dilation N/A 05/11/2015    Procedure: BALLOON DILATION;  Surgeon: Bjorn Loser, MD;  [facility];  Service: Urology;  Laterality: N/A;   Social History Social History  Substance Use Topics  . Smoking status: Former Smoker -- 2.00 packs/day for 25 years    Types: Cigarettes    Start date: 07/01/1960    Quit date: 06/03/1985  . Smokeless tobacco: Never Used  . Alcohol Use: No   Family History Family History  Problem Relation Age of Onset  . Cancer Mother     "  male organs"  . Diabetes Mother   . Cancer Brother     lung   Current Outpatient Prescriptions on File Prior to Visit  Medication Sig Dispense Refill  . ALPRAZolam (XANAX) 0.25 MG tablet Take 1 tablet (0.25 mg total) by mouth 2 (two) times daily as needed for anxiety. 60 tablet 0  . doxazosin (CARDURA) 4 MG tablet Take 4 mg by mouth at bedtime.     Marland Kitchen escitalopram (LEXAPRO) 10 MG tablet Take 1 tablet (10 mg total) by mouth daily. 30 tablet 6  . finasteride (PROSCAR) 5 MG tablet Take 5 mg by mouth every morning.     Marland Kitchen KLOR-CON M20 20 MEQ tablet Take 20 mEq by mouth daily.  3  . levothyroxine (SYNTHROID, LEVOTHROID) 125 MCG tablet Take 125 mcg by mouth daily before breakfast.    . lovastatin (MEVACOR) 20  MG tablet Take 1 tablet (20 mg total) by mouth at bedtime. 30 tablet 3  . Polyvinyl Alcohol-Povidone (REFRESH OP) Place 1 drop into both eyes daily as needed (dry eyes).    . predniSONE (DELTASONE) 20 MG tablet TAKE 1 TABLET (20 MG TOTAL) BY MOUTH DAILY WITH BREAKFAST. 30 tablet 3   Current Facility-Administered Medications on File Prior to Visit  Medication Dose Route Frequency Provider Last Rate Last Dose  . Influenza vac split quadrivalent PF (FLUARIX) injection 0.5 mL  0.5 mL Intramuscular Once Volanda Napoleon, MD   0.5 mL at 04/07/15 1604   Allergies  Allergen Reactions  . Nitrofuran Derivatives Other (See Comments)    ?     ROS: See HPI for pertinent positives and negatives.  Physical Examination  Filed Vitals:   11/14/15 1152  BP: 103/61  Pulse: 69  Height: 5\' 9"  (1.753 m)  Weight: 196 lb 6.4 oz (89.086 kg)  SpO2: 95%   Body mass index is 28.99 kg/(m^2).  General: A&O x 3, WD Eyes: Left pupil is larger than right and non reactive to light Pulmonary: Sym exp, respirations are non labored, limited air movt in all fields, CTAB, no rales, rhonchi, or wheezing.   Cardiac: RRR, Nl S1, S2, no murmur appreciated  Vascular: Vessel Right Left  Radial Palpable Palpable  Carotid  without bruit  without bruit  Aorta Not palpable N/A  Femoral Palpable Palpable  Popliteal Not palpable Not palpable  PT Palpable Palpable  DP Palpable Palpable   Gastrointestinal: soft, NTND, -G/R, - HSM, - palpable masses, - CVAT B.  Musculoskeletal: M/S 4/5 throughout  except 3/5 in LE's, extremities without ischemic changes. Purpuric areas on forearms.   Neurologic: Pain and light touch intact in extremities, Motor exam as listed above. Is hard of hearing.  Non-Invasive Vascular Imaging  EVAR Duplex (Date: 11/14/2015)  AAA sac size: 5.6 cm x 5.5 cm  Known Type 2 endoleak detected  Right CIA: 1.9 cm  Left CIA: 1.9 cm  Decreased visualization due to bowel gas and pt body  habitus  Mild decrease in the maximum diameter of the aorta compared to 11/08/14  11/08/14: AAA sac size: 6.0 cm x 5.7 cm   Medical Decision Making  DEWY KOLODZIEJ is a 82 y.o. male who presents s/p EVAR (Date: November 2015).  He has no back pain or abdominal pain referable to the stent graft.   I discussed with the patient the importance of surveillance of the endograft.  The next endograft duplex will be scheduled for 12 months.  The patient will follow up with [place] in 12 months with  these studies.  I emphasized the importance of maximal medical management including strict control of blood pressure, blood glucose, and lipid levels, antiplatelet agents, obtaining regular exercise, and cessation of smoking.   Thank you for allowing [place] to participate in this patient's care.  Clemon Chambers, RN, MSN, FNP-C [facility] [place] Office: [facility] Physician: Early  11/14/2015, 11:54 AM

## 2015-11-14 NOTE — Patient Instructions (Signed)
Before your next abdominal ultrasound:  Take two Extra-Strength Gas-X capsules at bedtime the night before the test. Take another two Extra-Strength Gas-X capsules 3 hours before the test.   

## 2015-11-16 ENCOUNTER — Telehealth: Payer: Self-pay | Admitting: Family

## 2015-11-16 ENCOUNTER — Other Ambulatory Visit: Payer: Self-pay | Admitting: Family

## 2015-11-16 DIAGNOSIS — D4620 Refractory anemia with excess of blasts, unspecified: Secondary | ICD-10-CM

## 2015-11-16 DIAGNOSIS — R233 Spontaneous ecchymoses: Secondary | ICD-10-CM

## 2015-11-16 NOTE — Telephone Encounter (Signed)
I spoke with Mr. Kohut's wife and she states that he has petechiae in his legs. We will have him come in tomorrow for lab work and take a look at his legs. They are in agreement with the plan.

## 2015-11-17 ENCOUNTER — Other Ambulatory Visit (HOSPITAL_BASED_OUTPATIENT_CLINIC_OR_DEPARTMENT_OTHER): Payer: Medicare Other

## 2015-11-17 ENCOUNTER — Telehealth: Payer: Self-pay | Admitting: Family

## 2015-11-17 DIAGNOSIS — D4620 Refractory anemia with excess of blasts, unspecified: Secondary | ICD-10-CM | POA: Diagnosis not present

## 2015-11-17 DIAGNOSIS — R233 Spontaneous ecchymoses: Secondary | ICD-10-CM

## 2015-11-17 LAB — COMPREHENSIVE METABOLIC PANEL
ALT: 25 U/L (ref 0–55)
ANION GAP: 8 meq/L (ref 3–11)
AST: 19 U/L (ref 5–34)
Albumin: 3.3 g/dL — ABNORMAL LOW (ref 3.5–5.0)
Alkaline Phosphatase: 48 U/L (ref 40–150)
BILIRUBIN TOTAL: 1.09 mg/dL (ref 0.20–1.20)
BUN: 18 mg/dL (ref 7.0–26.0)
CALCIUM: 8.4 mg/dL (ref 8.4–10.4)
CHLORIDE: 111 meq/L — AB (ref 98–109)
CO2: 23 meq/L (ref 22–29)
CREATININE: 1 mg/dL (ref 0.7–1.3)
EGFR: 71 mL/min/{1.73_m2} — ABNORMAL LOW (ref >90)
Glucose: 123 mg/dl (ref 70–140)
Potassium: 4.3 mEq/L (ref 3.5–5.1)
Sodium: 142 mEq/L (ref 136–145)
TOTAL PROTEIN: 6.9 g/dL (ref 6.4–8.3)

## 2015-11-17 LAB — CBC WITH DIFFERENTIAL (CANCER CENTER ONLY)
HCT: 34.1 % — ABNORMAL LOW (ref 38.7–49.9)
HEMOGLOBIN: 11.3 g/dL — AB (ref 13.0–17.1)
MCH: 39.2 pg — AB (ref 28.0–33.4)
MCHC: 33.1 g/dL (ref 32.0–35.9)
MCV: 118 fL — AB (ref 82–98)
PLATELETS: 120 10*3/uL — AB (ref 145–400)
RBC: 2.88 10*6/uL — AB (ref 4.20–5.70)
RDW: 17.6 % — ABNORMAL HIGH (ref 11.1–15.7)
WBC: 3.8 10*3/uL — AB (ref 4.0–10.0)

## 2015-11-17 LAB — MANUAL DIFFERENTIAL (CHCC SATELLITE)
ALC: 0.6 10*3/uL — AB (ref 0.9–3.3)
ANC (CHCC HP manual diff): 2.9 10*3/uL (ref 1.5–6.5)
Band Neutrophils: 3 % (ref 0–10)
LYMPH: 16 % (ref 14–48)
MONO: 7 % (ref 0–13)
Metamyelocytes: 1 % — ABNORMAL HIGH (ref 0–0)
Myelocytes: 2 % — ABNORMAL HIGH (ref 0–0)
PLT EST ~~LOC~~: DECREASED
SEG: 71 % (ref 40–75)

## 2015-11-17 LAB — CHCC SATELLITE - SMEAR

## 2015-11-17 MED FILL — ESCITALOPRAM 10 MG TABLET: 10 | 30 days supply | Qty: 30 | Fill #2

## 2015-11-17 MED FILL — predniSONE 20 MG TABS: 20 | 30 days supply | Qty: 30 | Fill #1

## 2015-11-17 NOTE — Telephone Encounter (Signed)
I spoke with Ms. Andrew [name] and let her know the patient's lab work today looked ok and we will decrease his Prednisone to 10 mg daily for now to eventually discontinue. She will contact us if the Petechiae worsens of if he has an episode of bleeding. She is is in agreement with this. We will call next week and check on him.

## 2015-11-30 ENCOUNTER — Encounter: Payer: Self-pay | Admitting: Cardiology

## 2015-12-20 ENCOUNTER — Other Ambulatory Visit: Payer: Self-pay | Admitting: Family

## 2015-12-20 MED FILL — predniSONE 20 MG TABS: 20 | 30 days supply | Qty: 30 | Fill #2

## 2015-12-20 MED FILL — ESCITALOPRAM 10 MG TABLET: 10 | 30 days supply | Qty: 30 | Fill #3

## 2015-12-22 MED FILL — ALPRAZolam 0.25 MG TABS: 0.25 | 30 days supply | Qty: 60 | Fill #0

## 2015-12-28 NOTE — Addendum Note (Signed)
Addended by: Reola Calkins on: 12/28/2015 02:36 PM   Modules accepted: Orders

## 2016-01-22 MED FILL — ESCITALOPRAM 10 MG TABLET: 10 | 30 days supply | Qty: 30 | Fill #4

## 2016-02-18 ENCOUNTER — Emergency Department (HOSPITAL_COMMUNITY)
Admission: EM | Admit: 2016-02-18 | Discharge: 2016-02-18 | Discharge status: Against Medical Advice | Payer: Medicare Other | Attending: Emergency Medicine | Admitting: Emergency Medicine

## 2016-02-18 ENCOUNTER — Emergency Department (HOSPITAL_COMMUNITY): Payer: Medicare Other

## 2016-02-18 ENCOUNTER — Encounter (HOSPITAL_COMMUNITY): Payer: Self-pay | Admitting: Emergency Medicine

## 2016-02-18 DIAGNOSIS — Y999 Unspecified external cause status: Secondary | ICD-10-CM | POA: Insufficient documentation

## 2016-02-18 DIAGNOSIS — Z79899 Other long term (current) drug therapy: Secondary | ICD-10-CM | POA: Diagnosis not present

## 2016-02-18 DIAGNOSIS — Z87891 Personal history of nicotine dependence: Secondary | ICD-10-CM | POA: Insufficient documentation

## 2016-02-18 DIAGNOSIS — Y939 Activity, unspecified: Secondary | ICD-10-CM | POA: Insufficient documentation

## 2016-02-18 DIAGNOSIS — S92312A Displaced fracture of first metatarsal bone, left foot, initial encounter for closed fracture: Secondary | ICD-10-CM | POA: Insufficient documentation

## 2016-02-18 DIAGNOSIS — R079 Chest pain, unspecified: Secondary | ICD-10-CM | POA: Insufficient documentation

## 2016-02-18 DIAGNOSIS — E039 Hypothyroidism, unspecified: Secondary | ICD-10-CM | POA: Diagnosis not present

## 2016-02-18 DIAGNOSIS — W1839XA Other fall on same level, initial encounter: Secondary | ICD-10-CM | POA: Diagnosis not present

## 2016-02-18 DIAGNOSIS — S92302A Fracture of unspecified metatarsal bone(s), left foot, initial encounter for closed fracture: Secondary | ICD-10-CM

## 2016-02-18 DIAGNOSIS — Y929 Unspecified place or not applicable: Secondary | ICD-10-CM | POA: Insufficient documentation

## 2016-02-18 DIAGNOSIS — I10 Essential (primary) hypertension: Secondary | ICD-10-CM | POA: Insufficient documentation

## 2016-02-18 DIAGNOSIS — N390 Urinary tract infection, site not specified: Secondary | ICD-10-CM

## 2016-02-18 DIAGNOSIS — S9032XA Contusion of left foot, initial encounter: Secondary | ICD-10-CM | POA: Diagnosis present

## 2016-02-18 LAB — URINALYSIS, ROUTINE W REFLEX MICROSCOPIC
BILIRUBIN URINE: NEGATIVE
Glucose, UA: NEGATIVE mg/dL
Hgb urine dipstick: NEGATIVE
Ketones, ur: NEGATIVE mg/dL
NITRITE: POSITIVE — AB
PROTEIN: NEGATIVE mg/dL
SPECIFIC GRAVITY, URINE: 1.011 (ref 1.005–1.030)
pH: 6 (ref 5.0–8.0)

## 2016-02-18 LAB — BASIC METABOLIC PANEL
Anion gap: 5 (ref 5–15)
BUN: 14 mg/dL (ref 6–20)
CALCIUM: 8.4 mg/dL — AB (ref 8.9–10.3)
CHLORIDE: 107 mmol/L (ref 101–111)
CO2: 25 mmol/L (ref 22–32)
CREATININE: 0.84 mg/dL (ref 0.61–1.24)
GFR calc non Af Amer: >60 mL/min (ref >60)
Glucose, Bld: 107 mg/dL — ABNORMAL HIGH (ref 65–99)
Potassium: 3.7 mmol/L (ref 3.5–5.1)
SODIUM: 137 mmol/L (ref 135–145)

## 2016-02-18 LAB — CBC WITH DIFFERENTIAL/PLATELET
BASOS ABS: 0 10*3/uL (ref 0.0–0.1)
BASOS PCT: 0 %
EOS ABS: 0 10*3/uL (ref 0.0–0.7)
Eosinophils Relative: 0 %
HCT: 35.3 % — ABNORMAL LOW (ref 39.0–52.0)
HEMOGLOBIN: 10.8 g/dL — AB (ref 13.0–17.0)
LYMPHS ABS: 0.8 10*3/uL (ref 0.7–4.0)
Lymphocytes Relative: 18 %
MCH: 35.4 pg — AB (ref 26.0–34.0)
MCHC: 30.6 g/dL (ref 30.0–36.0)
MCV: 115.7 fL — ABNORMAL HIGH (ref 78.0–100.0)
MONO ABS: 0.9 10*3/uL (ref 0.1–1.0)
Monocytes Relative: 21 %
NEUTROS ABS: 2.8 10*3/uL (ref 1.7–7.7)
Neutrophils Relative %: 61 %
Platelets: 132 10*3/uL — ABNORMAL LOW (ref 150–400)
RBC: 3.05 MIL/uL — ABNORMAL LOW (ref 4.22–5.81)
RDW: 17.4 % — AB (ref 11.5–15.5)
WBC: 4.5 10*3/uL (ref 4.0–10.5)

## 2016-02-18 LAB — I-STAT TROPONIN, ED: Troponin i, poc: 0 ng/mL (ref 0.00–0.08)

## 2016-02-18 LAB — URINE MICROSCOPIC-ADD ON
RBC / HPF: NONE SEEN RBC/hpf (ref 0–5)
SQUAMOUS EPITHELIAL / LPF: NONE SEEN

## 2016-02-18 MED ORDER — LEVOFLOXACIN 750 MG PO TABS: 750.0000 mg | ORAL_TABLET | Freq: Every day | ORAL | 0 refills | Status: AC

## 2016-02-18 MED ORDER — LEVOFLOXACIN 750 MG PO TABS
750.0000 mg | ORAL_TABLET | Freq: Once | ORAL | Status: AC
  Administered 2016-02-18: 750 mg via ORAL
  Filled 2016-02-18: qty 1

## 2016-02-18 MED ORDER — DEXTROSE 5 % IV SOLN
1.0000 g | Freq: Once | INTRAVENOUS | Status: DC
  Filled 2016-02-18: qty 10

## 2016-02-18 NOTE — Discharge Instructions (Signed)
You have chosen to leave the hospital against medical advise. Take levaquin as prescribed for UTI until all gone. Follow up with your doctor for recheck and further evaluation of your chest pain.

## 2016-02-18 NOTE — ED Triage Notes (Signed)
Pt. Arrived transported by EMS from home. EMS report: C/o onset of chest pain. Pt. Took a quantity of 5 aspirin, 81 mg each. sinus rhythm with occasional PACs. Right sided 12 lead & 15 lead; no ST elevation noted. SPO at 91% on RA, put on 4L & then at 94%. No c/o SHOB & no c/o chest pain during transport. 18G in right wrist. Blood glucose 120. History of hemochrotosis & hypothyroidism.

## 2016-02-18 NOTE — ED Notes (Addendum)
Patient transported to X-ray 

## 2016-02-18 NOTE — ED Notes (Signed)
Pt. Talking with dr. Abbott Pao. Does not want rocephin.

## 2016-02-18 NOTE — ED Provider Notes (Signed)
[facility] DEPT Provider Note   CSN: [id number] Arrival date & time: 02/18/16  [id number]     History   Chief Complaint Chief Complaint  Patient presents with  . Chest Pain    HPI Andrew [name] is a 82 y.o. male.  HPI Andrew [name] is a 82 y.o. male with history of depression, hemochromatosis, neurogenic bladder, peripheral vascular disease, anemia, presents to emergency department with complaint of generalized weakness and chest pain. Patient reports that he had a spinal fracture with surgical repair approximate 10 years ago, since then he has not been able to walk well. He states he walks with assistance and uses wheelchair most of the time. He reports this morning, his wife helped him to the bathroom, but he was unable to stand up to even transfer. He then noticed that he was having some chest pain. He states pain in his chest now resolved. He denies any associated shortness of breath. He does report cough for the last several days. He denies any prior cardiac problems. Wife reports the patient has fallen several times over the last week, states fell 5 days ago and injured left ankle, and now has significant swelling and bruising to the ankle and foot. Patient states that he was not able to stand up not because of his ankle. He states he has very little pain to the ankle. He reports similar chest pain approximately 2 weeks ago, did not seek medical help at that time.  Past Medical History:  Diagnosis Date  . Aneurysm of internal iliac artery (HCC)    right 14 mm per ct (monitored by dr early)  . Depression   . Foot pain    while lying flat  . Hereditary hemochromatosis 12/24/2013  oncologist/  hemotologist--  dr Marin Olp   (homozygous for C282Y mutation)    . Hyperlipidemia   . Hypothyroidism   . MDS (myelodysplastic syndrome), low grade ([place]) 12/24/2013   ONCOLOGIST--  DR Marin Olp--  tx  has had phlebotomy and get aranesp  . Neurogenic bladder   . Peripheral vascular disease  ([place])   . Refractory anemia with multi-lineage dysplasia [facility]) oncologis/ hemotologist-  dr Marin Olp   Trisomy 8  . S/P AAA repair    was 5.8cc  repair 04-08-2014  endovascular stent graft  . Self-catheterizes urinary bladder   . Shingles    L eye  . Urethral stricture   . Wears glasses   . Wears hearing aid    bilateral  . Wears partial dentures    upper and lower    Patient Active Problem List   Diagnosis Date Noted  . Neurogenic bladder 06/14/2015  . Abdominal aortic aneurysm without rupture ([place]) 04/08/2014  . AAA (abdominal aortic aneurysm) without rupture ([place]) 03/15/2014  . Hereditary hemochromatosis 12/24/2013  . MDS (myelodysplastic syndrome), low grade ([place]) 12/24/2013  . Abdominal aortic aneurysm ([place]) 04/11/2011    Past Surgical History:  Procedure Laterality Date  . ABDOMINAL AORTIC ENDOVASCULAR STENT GRAFT N/A 04/08/2014   Procedure: ABDOMINAL AORTIC ENDOVASCULAR STENT GRAFT;  Surgeon: Rosetta Posner, MD;  Location: [facility];  Service: Vascular;  Laterality: N/A;  . BALLOON DILATION N/A 05/11/2015   Procedure: [place];  Surgeon: Bjorn Loser, MD;  Location: [facility];  Service: Urology;  Laterality: N/A;  . CATARACT EXTRACTION W/ INTRAOCULAR LENS  IMPLANT, BILATERAL  2013  . CYSTOSCOPY WITH RETROGRADE URETHROGRAM N/A 05/11/2015   Procedure: CYSTO WITH URETHROGRAM;  Surgeon: Bjorn Loser, MD;  Location: [facility];  Service: Urology;  Laterality: N/A;  . INGUINAL HERNIA REPAIR  yrs ago  . LAMINECTOMY THORACIC FUSION POSTERIOR  04-16-2006   laminectomy T12,  fusion T10 -- L2       Home Medications    Prior to Admission medications   Medication Sig Start Date End Date Taking? Authorizing Provider  ALPRAZolam (XANAX) 0.25 MG tablet TAKE 1 TABLET BY MOUTH 2 TIMES DAILY AS NEEDED FOR ANXIETY 12/20/15   Eliezer Bottom, NP  atropine 1 % ophthalmic solution INSTILL ONE DROP INTO THE LEFT EYE ONCE DAILY 10/19/15    Historical Provider, MD  doxazosin (CARDURA) 4 MG tablet Take 4 mg by mouth at bedtime.     Historical Provider, MD  escitalopram (LEXAPRO) 10 MG tablet Take 1 tablet (10 mg total) by mouth daily. 09/08/15   Eliezer Bottom, NP  finasteride (PROSCAR) 5 MG tablet Take 5 mg by mouth every morning.     Historical Provider, MD  KLOR-CON M20 20 MEQ tablet Take 20 mEq by mouth daily. 03/10/15   Historical Provider, MD  levothyroxine (SYNTHROID, LEVOTHROID) 125 MCG tablet Take 125 mcg by mouth daily before breakfast.    Historical Provider, MD  lovastatin (MEVACOR) 20 MG tablet Take 1 tablet (20 mg total) by mouth at bedtime. 09/08/15   Eliezer Bottom, NP  Polyvinyl Alcohol-Povidone (REFRESH OP) Place 1 drop into both eyes daily as needed (dry eyes).    Historical Provider, MD  prednisoLONE acetate (PRED FORTE) 1 % ophthalmic suspension PLACE 1 DROP IN LEFT EYE EVERY 2 HOURS WHILE AWAKE 10/19/15   Historical Provider, MD  predniSONE (DELTASONE) 20 MG tablet TAKE 1 TABLET (20 MG TOTAL) BY MOUTH DAILY WITH BREAKFAST. 10/18/15   Eliezer Bottom, NP    Family History Family History  Problem Relation Age of Onset  . Cancer Mother     "male organs"  . Diabetes Mother   . Cancer Brother     lung    Social History Social History  Substance Use Topics  . Smoking status: Former Smoker    Packs/day: 2.00    Years: 25.00    Types: Cigarettes    Start date: 07/01/1960    Quit date: 06/03/1985  . Smokeless tobacco: Never Used  . Alcohol use No     Allergies   Nitrofuran derivatives   Review of Systems Review of Systems  Constitutional: Negative for chills and fever.  Respiratory: Negative for cough, chest tightness and shortness of breath.   Cardiovascular: Positive for chest pain. Negative for palpitations and leg swelling.  Gastrointestinal: Negative for abdominal distention, abdominal pain, diarrhea, nausea and vomiting.  Genitourinary: Negative for dysuria, frequency, hematuria and  urgency.  Musculoskeletal: Negative for arthralgias, myalgias, neck pain and neck stiffness.  Skin: Negative for rash.  Allergic/Immunologic: Negative for immunocompromised state.  Neurological: Positive for weakness. Negative for dizziness, light-headedness, numbness and headaches.     Physical Exam Updated Vital Signs BP (!) 110/47 (BP Location: Right Arm)   Pulse 74   Temp 98.2 F (36.8 C) (Oral)   Resp 16   Ht 5\' 9"  (1.753 m)   Wt 86.2 kg   SpO2 94%   BMI 28.06 kg/m   Physical Exam  Constitutional: He appears well-developed and well-nourished. No distress.  HENT:  Head: Normocephalic and atraumatic.  Eyes: Conjunctivae are normal.  Neck: Neck supple.  Cardiovascular: Normal rate, regular rhythm and normal heart sounds.   Pulmonary/Chest: Effort normal. No respiratory distress. He has  no wheezes. He has no rales. He exhibits no tenderness.  Abdominal: Soft. Bowel sounds are normal. He exhibits no distension. There is no tenderness. There is no rebound.  Musculoskeletal: He exhibits no edema.  Significant swelling and bruising to the left ankle and foot. Dorsal pedal pulse intact. Cap refill less than 2 seconds distally. Full range motion of the ankle and all toes.  Neurological: He is alert.  Skin: Skin is warm and dry.  Nursing note and vitals reviewed.    ED Treatments / Results  Labs (all labs ordered are listed, but only abnormal results are displayed) Labs Reviewed  CBC WITH DIFFERENTIAL/PLATELET - Abnormal; Notable for the following:       Result Value   RBC 3.05 (*)    Hemoglobin 10.8 (*)    HCT 35.3 (*)    MCV 115.7 (*)    MCH 35.4 (*)    RDW 17.4 (*)    Platelets 132 (*)    All other components within normal limits  BASIC METABOLIC PANEL - Abnormal; Notable for the following:    Glucose, Bld 107 (*)    Calcium 8.4 (*)    All other components within normal limits  URINALYSIS, ROUTINE W REFLEX MICROSCOPIC (NOT AT [facility]) - Abnormal; Notable for the  following:    APPearance CLOUDY (*)    Nitrite POSITIVE (*)    Leukocytes, UA MODERATE (*)    All other components within normal limits  URINE MICROSCOPIC-ADD ON - Abnormal; Notable for the following:    Bacteria, UA MANY (*)    All other components within normal limits  URINE CULTURE  I-STAT TROPOININ, ED    EKG  EKG Interpretation  Date/Time:  Sunday February 18 2016 09:38:33 EDT Ventricular Rate:  77 PR Interval:    QRS Duration: 103 QT Interval:  426 QTC Calculation: 483 R Axis:   9 Text Interpretation:  Sinus rhythm Borderline prolonged QT interval No signficant change since last tracing Confirmed by ISAACS MD, Lysbeth Galas [phone]) on 02/18/2016 11:50:31 AM       Radiology Dg Chest 2 View  Result Date: 02/18/2016 CLINICAL DATA:  Central chest pain beginning this morning, fell earlier this week, former smoker EXAM: CHEST  2 VIEW COMPARISON:  04/08/2014 FINDINGS: Enlargement of cardiac silhouette with slight vascular congestion. Atherosclerotic calcification and mild elongation of thoracic aorta. Slight rotation to the RIGHT . Improved bibasilar aeration since previous exam with residual atelectasis or scarring at both bases. Underlying emphysematous changes without acute infiltrate, pleural effusion, or pneumothorax. Bones appear demineralized. Prior thoracolumbar surgery. Degenerative changes of BILATERAL glenohumeral joints. IMPRESSION: COPD changes with bibasilar atelectasis versus scarring. Improved aeration since previous exam. Enlargement of cardiac silhouette minimal vascular congestion without acute infiltrate. Electronically Signed   By: Lavonia Dana M.D.   On: 02/18/2016 10:53   Dg Ankle Complete Left  Result Date: 02/18/2016 CLINICAL DATA:  [place] earlier this week, generalized LEFT foot and ankle pain, swelling, and bruising EXAM: LEFT ANKLE COMPLETE - 3+ VIEW COMPARISON:  None FINDINGS: Marked osseous demineralization. Ankle mortise intact. Soft tissue swelling greatest  laterally. No acute fracture, dislocation or bone destruction. Pes cavus and small plantar calcaneal spur noted. IMPRESSION: No acute osseous abnormalities. Electronically Signed   By: Lavonia Dana M.D.   On: 02/18/2016 10:57   Dg Foot Complete Left  Result Date: 02/18/2016 CLINICAL DATA:  [place] earlier this week, generalized LEFT foot and ankle pain, swelling, and bruising EXAM: LEFT FOOT - COMPLETE 3+ VIEW COMPARISON:  None FINDINGS: Marked osseous demineralization. Scattered joint space narrowing. Nondisplaced fracture first metatarsal head. Pes cavus. Small plantar calcaneal spur. Scattered soft tissue swelling. No additional fracture, dislocation, or bone destruction. IMPRESSION: Nondisplaced fracture at LEFT first metatarsal head. Electronically Signed   By: Lavonia Dana M.D.   On: 02/18/2016 10:56    Procedures Procedures (including critical care time)  Medications Ordered in ED Medications - No data to display   Initial Impression / Assessment and Plan / ED Course  I have reviewed the triage vital signs and the nursing notes.  Pertinent labs & imaging results that were available during my care of the patient were reviewed by me and considered in my medical decision making (see chart for details).  Clinical Course   10:05 AM  Pt in ED with increased weakness, chest pain. No prior cardiac problems. Will check ecg, labs chest xray. Pain free at this time  Patient's blood work unremarkable. Patient does have a fracture of his first metatarsal of the left foot from a fall last week. Urine analysis showing infection. Rocephin ordered. I discussed with family if they would like [place] to set them up with at home physical therapy or occupational therapy or perhaps a nurse, to which patient became very angry with and stated "absolutely not." The patient also refused Rocephin. Discussed with Dr.Isaacs, who has seen pt. Pt does not want admission. Pt refused rocephin. Refused CAM walker. Refused repeat  troponing. DC home with outpatient follow up.  We did review pt's prior urine culture and will treat with leavaquin based on sensitivities. Culture of his urine today was sent. Pt is not septic, non toxic appearing.   Vitals:   02/18/16 0950 02/18/16 1000 02/18/16 1145 02/18/16 1215  BP:  113/56 (!) 102/52 139/62  Pulse:  70 64 70  Resp:  18 18 17   Temp:      TempSrc:      SpO2:  93% 94% 96%  Weight: 86.2 kg     Height: 5\' 9"  (1.753 m)        Final Clinical Impressions(s) / ED Diagnoses   Final diagnoses:  UTI (lower urinary tract infection)  Chest pain, unspecified chest pain type  Metatarsal fracture, left, closed, initial encounter    New Prescriptions Discharge Medication List as of 02/18/2016 12:40 PM    START taking these medications   Details  levofloxacin (LEVAQUIN) 750 MG tablet Take 1 tablet (750 mg total) by mouth daily., Starting Sun 02/18/2016, Print         Jeannett Senior, PA-C 02/18/16 1440    Duffy Bruce, MD 02/19/16 [id number]    Duffy Bruce, MD 02/19/16 [phone]

## 2016-02-19 ENCOUNTER — Other Ambulatory Visit: Payer: Self-pay | Admitting: Family

## 2016-02-19 MED FILL — ESCITALOPRAM 10 MG TABLET: 10 | 30 days supply | Qty: 30 | Fill #5

## 2016-02-20 MED FILL — ALPRAZolam 0.25 MG TABS: 0.25 | 30 days supply | Qty: 60 | Fill #0

## 2016-02-21 LAB — URINE CULTURE: Culture: >=100000 — AB

## 2016-02-22 ENCOUNTER — Telehealth (HOSPITAL_BASED_OUTPATIENT_CLINIC_OR_DEPARTMENT_OTHER): Payer: Self-pay | Admitting: Emergency Medicine

## 2016-02-22 NOTE — Telephone Encounter (Signed)
Post ED Visit - Positive Culture Follow-up  Culture report reviewed by antimicrobial stewardship pharmacist:  []  Elenor Quinones, Pharm.D. []  Heide Guile, Pharm.D., BCPS []  Parks Neptune, Pharm.D. []  Alycia Rossetti, Pharm.D., BCPS []  [place], Pharm.D., BCPS, AAHIVP []  Legrand Como, Pharm.D., BCPS, AAHIVP []  Milus Glazier, Pharm.D. []  Stephens November, [place].D. Dimitri Ped Pharm D  Positive urine culture Treated with levofloxacin , organism sensitive to the same and no further patient follow-up is required at this time.  Hazle Nordmann 02/22/2016, 10:07 AM

## 2016-03-05 ENCOUNTER — Ambulatory Visit (HOSPITAL_BASED_OUTPATIENT_CLINIC_OR_DEPARTMENT_OTHER): Payer: Medicare Other | Admitting: Family

## 2016-03-05 ENCOUNTER — Encounter: Payer: Self-pay | Admitting: Family

## 2016-03-05 ENCOUNTER — Ambulatory Visit: Payer: Medicare Other

## 2016-03-05 ENCOUNTER — Other Ambulatory Visit (HOSPITAL_BASED_OUTPATIENT_CLINIC_OR_DEPARTMENT_OTHER): Payer: Medicare Other

## 2016-03-05 ENCOUNTER — Encounter: Payer: Self-pay | Admitting: *Deleted

## 2016-03-05 VITALS — BP 101/51 | HR 62 | Temp 97.7°F | Resp 16 | Ht 69.0 in | Wt 199.0 lb

## 2016-03-05 DIAGNOSIS — R5382 Chronic fatigue, unspecified: Secondary | ICD-10-CM

## 2016-03-05 DIAGNOSIS — D4620 Refractory anemia with excess of blasts, unspecified: Secondary | ICD-10-CM | POA: Diagnosis not present

## 2016-03-05 DIAGNOSIS — D510 Vitamin B12 deficiency anemia due to intrinsic factor deficiency: Secondary | ICD-10-CM

## 2016-03-05 DIAGNOSIS — F419 Anxiety disorder, unspecified: Principal | ICD-10-CM

## 2016-03-05 DIAGNOSIS — D46Z Other myelodysplastic syndromes: Secondary | ICD-10-CM

## 2016-03-05 DIAGNOSIS — R630 Anorexia: Secondary | ICD-10-CM

## 2016-03-05 DIAGNOSIS — F329 Major depressive disorder, single episode, unspecified: Secondary | ICD-10-CM

## 2016-03-05 LAB — CBC WITH DIFFERENTIAL (CANCER CENTER ONLY)
HEMATOCRIT: 34.3 % — AB (ref 38.7–49.9)
HGB: 11.2 g/dL — ABNORMAL LOW (ref 13.0–17.1)
MCH: 37.5 pg — AB (ref 28.0–33.4)
MCHC: 32.7 g/dL (ref 32.0–35.9)
MCV: 115 fL — AB (ref 82–98)
PLATELETS: 141 10*3/uL — AB (ref 145–400)
RBC: 2.99 10*6/uL — AB (ref 4.20–5.70)
RDW: 16.3 % — ABNORMAL HIGH (ref 11.1–15.7)
WBC: 7.7 10*3/uL (ref 4.0–10.0)

## 2016-03-05 LAB — COMPREHENSIVE METABOLIC PANEL
ALK PHOS: 105 U/L (ref 40–150)
ALT: 17 U/L (ref 0–55)
ANION GAP: 8 meq/L (ref 3–11)
AST: 23 U/L (ref 5–34)
Albumin: 2.8 g/dL — ABNORMAL LOW (ref 3.5–5.0)
BILIRUBIN TOTAL: 0.51 mg/dL (ref 0.20–1.20)
BUN: 9.6 mg/dL (ref 7.0–26.0)
CALCIUM: 8.5 mg/dL (ref 8.4–10.4)
CO2: 24 meq/L (ref 22–29)
CREATININE: 0.8 mg/dL (ref 0.7–1.3)
Chloride: 110 mEq/L — ABNORMAL HIGH (ref 98–109)
EGFR: 83 mL/min/{1.73_m2} — AB (ref >90)
Glucose: 115 mg/dl (ref 70–140)
Potassium: 3.6 mEq/L (ref 3.5–5.1)
Sodium: 142 mEq/L (ref 136–145)
TOTAL PROTEIN: 7.8 g/dL (ref 6.4–8.3)

## 2016-03-05 LAB — MANUAL DIFFERENTIAL (CHCC SATELLITE)
ALC: 2.6 10*3/uL (ref 0.9–3.3)
ANC (CHCC MAN DIFF): 3.8 10*3/uL (ref 1.5–6.5)
BAND NEUTROPHILS: 10 % (ref 0–10)
LYMPH: 34 % (ref 14–48)
MONO: 16 % — ABNORMAL HIGH (ref 0–13)
MYELOCYTES: 3 % — AB (ref 0–0)
Metamyelocytes: 5 % — ABNORMAL HIGH (ref 0–0)
PLT EST ~~LOC~~: ADEQUATE
SEG: 32 % — AB (ref 40–75)

## 2016-03-05 LAB — IRON AND TIBC
%SAT: >100 % (ref 20–?)
IRON: 123 ug/dL (ref 42–163)
TIBC: 121 ug/dL — AB (ref 202–409)
UIBC: <1 ug/dL (ref 117–376)

## 2016-03-05 LAB — FERRITIN

## 2016-03-05 MED ORDER — PREDNISONE 5 MG PO TABS: ORAL_TABLET | ORAL | 0 refills | Status: DC

## 2016-03-05 MED FILL — predniSONE 5 MG TABS: 5 | 14 days supply | Qty: 14 | Fill #0

## 2016-03-05 NOTE — Progress Notes (Signed)
Hematology and Oncology Follow Up Visit  Andrew [name] [id number] [birth year] 82 y.o. 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 He is feeling much better since we last saw him. He has really done well on Lexapro. He has been enjoying the fall weather sitting on his porch and has a friendly squirrel that he feeds and will come sit on his chair.  The last 2 weeks have been a bit difficult for he and his wife as they lost their son. Their passed away in his sleep They are not sure what happened but he did have a history of heart problems. They have a sweet family and good support system.  His quality of life is much better now that he stopped having phlebotomies. He has chronically high iron levels but is unable to tolerate treatment.  He states that he has had 4 falls due to tripping or slipping on wet floors at home since we last saw him. Thankfully he was not seriously injured. He uses his walker at home and also uses his wheelchair quite a bit.  He has no swelling in his extremities. The numbness and tingling in his hands and feet has not changed.  No fever, chills, n/v, cough, rash, headache, dizziness, blurred vision, SOB, chest pain, palpitations, abdominal pain or changes in bladder habits.  He continues to have occasional UTIs and just completed treatment with Monurol.  He has maintained a good appetite and is staying well hydrated. His weight is improved.   Medications:    Medication List       Accurate as of 03/05/16 10:24 AM. Always use your most recent med list.          ALPRAZolam 0.25 MG tablet Commonly known as:  XANAX TAKE 1 TABLET BY MOUTH TWICE DAILY  AS NEEDED FOR ANXIETY   atropine 1 % ophthalmic solution INSTILL ONE DROP INTO THE LEFT EYE ONCE DAILY   doxazosin 4 MG tablet Commonly known as:  CARDURA Take 4 mg by mouth at bedtime.   escitalopram 10 MG tablet Commonly known as:  LEXAPRO Take 1 tablet (10 mg total) by mouth daily.   finasteride 5 MG tablet Commonly known as:  PROSCAR Take 5 mg by mouth every morning.   KLOR-CON M20 20 MEQ tablet Generic drug:  potassium chloride SA Take 20 mEq by mouth daily.   levofloxacin 750 MG tablet Commonly known as:  LEVAQUIN Take 1 tablet (750 mg total) by mouth daily.   levothyroxine 125 MCG tablet Commonly known as:  SYNTHROID, LEVOTHROID Take 125 mcg by mouth daily before breakfast.   lovastatin 20 MG tablet Commonly known as:  MEVACOR Take 1 tablet (20 mg total) by mouth at bedtime.   prednisoLONE acetate 1 % ophthalmic suspension Commonly known as:  PRED FORTE PLACE 1 DROP IN LEFT EYE EVERY 2 HOURS WHILE AWAKE   predniSONE 20 MG tablet Commonly known as:  DELTASONE TAKE 1 TABLET (20 MG TOTAL) BY MOUTH DAILY WITH BREAKFAST.   REFRESH OP Place 1 drop into both eyes daily as needed (dry eyes).       Allergies:  Allergies  Allergen Reactions  . Nitrofuran Derivatives Other (See Comments)    ?  Past Medical History, Surgical history, Social history, and Family History were reviewed and updated.  Review of Systems: All other 10 point review of systems is negative.   Physical Exam:  height is 5\' 9"  (1.753 m) and weight is 199 lb (90.3 kg). His oral temperature is 97.7 F (36.5 C). His blood pressure is 101/51 (abnormal) and his pulse is 62. His respiration is 16.   Wt Readings from Last 3 Encounters:  03/05/16 199 lb (90.3 kg)  02/18/16 190 lb (86.2 kg)  11/14/15 196 lb 6.4 oz (89.1 kg)    Ocular: Sclerae unicteric, pupils equal, round and reactive to light Ear-nose-throat: Oropharynx clear, dentition fair Lymphatic: No cervical supraclavicular or  axillary adenopathy Lungs no rales or rhonchi, good excursion bilaterally Heart regular rate and rhythm, no murmur appreciated Abd soft, nontender, positive bowel sounds, no liver or spleen tip palpated on exam, no fluid wave MSK no focal spinal tenderness, no joint edema Neuro: non-focal, well-oriented, appropriate affect Breast: Deferred  Lab Results  Component Value Date   WBC 7.7 03/05/2016   HGB 11.2 (L) 03/05/2016   HCT 34.3 (L) 03/05/2016   MCV 115 (H) 03/05/2016   PLT 141 (L) 03/05/2016   Lab Results  Component Value Date   FERRITIN 1,379 (H) 09/08/2015   IRON 167 (H) 09/08/2015   TIBC 171 (L) 09/08/2015   UIBC 4 (L) 09/08/2015   IRONPCTSAT 98 (H) 09/08/2015   Lab Results  Component Value Date   RETICCTPCT 2.3 05/26/2015   RBC 2.99 (L) 03/05/2016   RETICCTABS 67.9 05/26/2015   No results found for: KPAFRELGTCHN, LAMBDASER, KAPLAMBRATIO No results found for: IGGSERUM, IGA, IGMSERUM No results found for: Odetta Pink, SPEI   Chemistry      Component Value Date/Time   NA 137 02/18/2016 1056   NA 142 11/17/2015 1336   K 3.7 02/18/2016 1056   K 4.3 11/17/2015 1336   CL 107 02/18/2016 1056   CL 104 08/04/2015 1043   CO2 25 02/18/2016 1056   CO2 23 11/17/2015 1336   BUN 14 02/18/2016 1056   BUN 18.0 11/17/2015 1336   CREATININE 0.84 02/18/2016 1056   CREATININE 1.0 11/17/2015 1336      Component Value Date/Time   CALCIUM 8.4 (L) 02/18/2016 1056   CALCIUM 8.4 11/17/2015 1336   ALKPHOS 48 11/17/2015 1336   AST 19 11/17/2015 1336   ALT 25 11/17/2015 1336   BILITOT 1.09 11/17/2015 1336     Impression and Plan: Andrew [name] is 82 yo white male with both myelodysplasia and hemochromatosis. He has done well and has no complaints at this time. His quality of life is much better since he stopped having phlebotomies. He was really struggling with fatigue and other symptoms that were debilitating. His Hgb is stable  at this time is 11.2. No Aranesp needed.  LFT's look good. His iron studies remain chronically elevated. He still prefers not to be phlebotomized.  He is smiling and talkative at this time. He has really responded nicely to the Lexapro. He denies feeling depressed or anxious.  He would like to wean off of the Prednisone so we will have him take 5 mg daily for 2 weeks and then stop.  He would also like to keep his follow-up appointments at every 6 months. We will continue to follow along with him and plan to see him back in April of next year.  Both he and his wife know to contact us  if he becomes symptomatic with anemia and with any questions or concerns. We can certainly see him sooner if need be.   Eliezer Bottom, NP 10/3/201710:24 AM

## 2016-03-06 LAB — RETICULOCYTES: RETICULOCYTE COUNT: 3.3 % — AB (ref 0.6–2.6)

## 2016-03-08 ENCOUNTER — Ambulatory Visit: Payer: Medicare Other

## 2016-03-08 ENCOUNTER — Other Ambulatory Visit: Payer: Medicare Other

## 2016-03-08 ENCOUNTER — Ambulatory Visit: Payer: Medicare Other | Admitting: Family

## 2016-03-11 ENCOUNTER — Other Ambulatory Visit: Payer: Medicare Other

## 2016-03-11 ENCOUNTER — Ambulatory Visit: Payer: Medicare Other

## 2016-03-11 ENCOUNTER — Ambulatory Visit: Payer: Medicare Other | Admitting: Family

## 2016-03-19 MED FILL — ESCITALOPRAM 10 MG TABLET: 10 | 30 days supply | Qty: 30 | Fill #6

## 2016-03-29 ENCOUNTER — Other Ambulatory Visit: Payer: Self-pay | Admitting: Family

## 2016-03-29 DIAGNOSIS — D631 Anemia in chronic kidney disease: Secondary | ICD-10-CM

## 2016-04-18 ENCOUNTER — Other Ambulatory Visit: Payer: Self-pay | Admitting: Family

## 2016-04-18 ENCOUNTER — Other Ambulatory Visit: Payer: Self-pay | Admitting: Hematology & Oncology

## 2016-04-18 DIAGNOSIS — F419 Anxiety disorder, unspecified: Principal | ICD-10-CM

## 2016-04-18 DIAGNOSIS — F329 Major depressive disorder, single episode, unspecified: Secondary | ICD-10-CM

## 2016-04-18 DIAGNOSIS — D510 Vitamin B12 deficiency anemia due to intrinsic factor deficiency: Secondary | ICD-10-CM

## 2016-04-18 DIAGNOSIS — D4620 Refractory anemia with excess of blasts, unspecified: Secondary | ICD-10-CM

## 2016-04-18 DIAGNOSIS — F32A Depression, unspecified: Secondary | ICD-10-CM

## 2016-04-18 MED FILL — ALPRAZolam 0.25 MG TABS: 0.25 | 30 days supply | Qty: 60 | Fill #0

## 2016-04-18 MED FILL — ESCITALOPRAM 10 MG TABLET: 10 | 30 days supply | Qty: 30 | Fill #0

## 2016-05-20 MED FILL — ESCITALOPRAM 10 MG TABLET: 10 | 30 days supply | Qty: 30 | Fill #1

## 2016-06-19 ENCOUNTER — Other Ambulatory Visit: Payer: Self-pay | Admitting: *Deleted

## 2016-06-19 MED ORDER — ALPRAZOLAM 0.25 MG PO TABS: 0.2500 mg | ORAL_TABLET | Freq: Two times a day (BID) | ORAL | 0 refills | Status: DC | PRN

## 2016-06-19 MED FILL — ESCITALOPRAM 10 MG TABLET: 10 | 30 days supply | Qty: 30 | Fill #2

## 2016-06-19 MED FILL — ALPRAZolam 0.25 MG TABS: 0.25 | 30 days supply | Qty: 60 | Fill #0

## 2016-07-18 MED FILL — ESCITALOPRAM 10 MG TABLET: 10 | 30 days supply | Qty: 30 | Fill #3

## 2016-08-11 IMAGING — CT CT CTA ABD/PEL W/CM AND/OR W/O CM
3 of 10 series · 10 of 36 positions shown, 15 images · IV contrast (80CC OMNI 350)
Comparison: 03/15/2014

CLINICAL DATA: Aortic stent graft

EXAM:
CT ANGIOGRAPHY ABDOMEN AND PELVIS WITH CONTRAST AND WITHOUT CONTRAST
TECHNIQUE: Multidetector CT imaging of the abdomen and pelvis was performed
using the standard protocol during bolus administration of
intravenous contrast. Multiplanar reconstructed images and MIPs were
obtained and reviewed to evaluate the vascular anatomy.
CONTRAST:  80mL OMNIPAQUE IOHEXOL 350 MG/ML SOLN

[Series 5: angio 2.5 · axial · 0.88mm/px · z∈[-346,-56]mm · 4 of 194 slices shown, 9 images]
[im 39/194  soft-tissue]
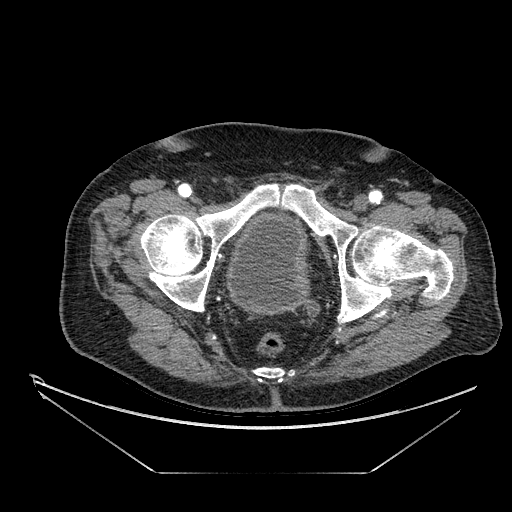
[im 39/194  lung]
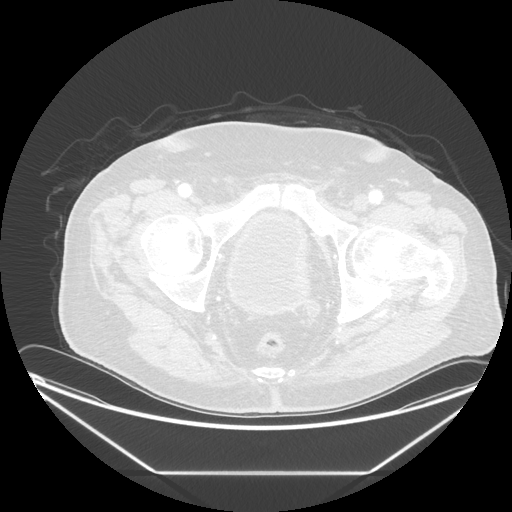
[im 39/194  bone]
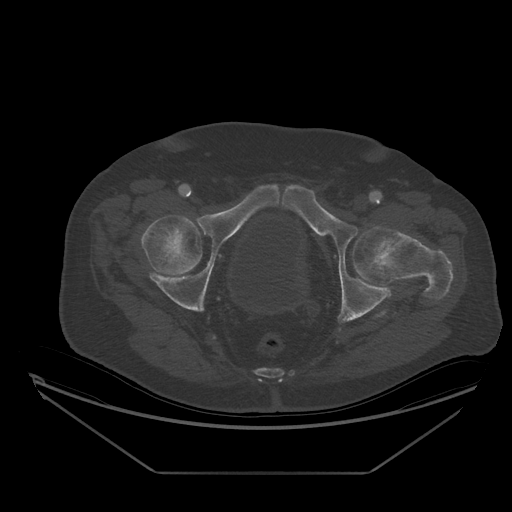
[im 78/194  soft-tissue]
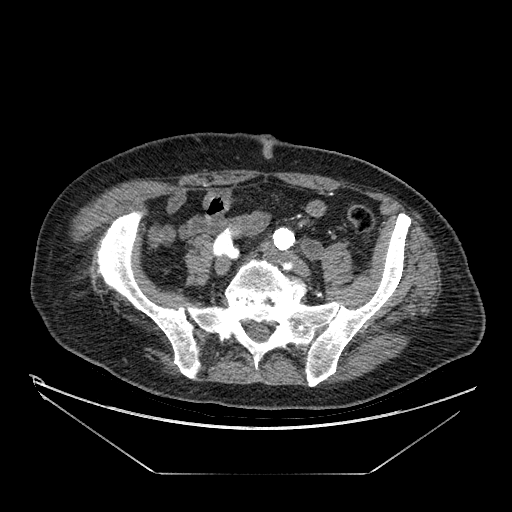
[im 78/194  lung]
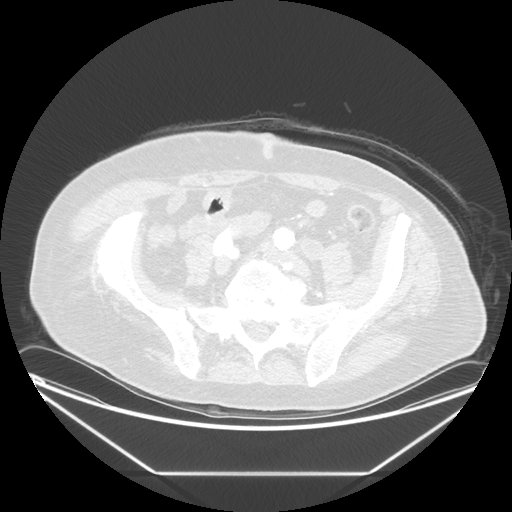
[im 116/194  soft-tissue]
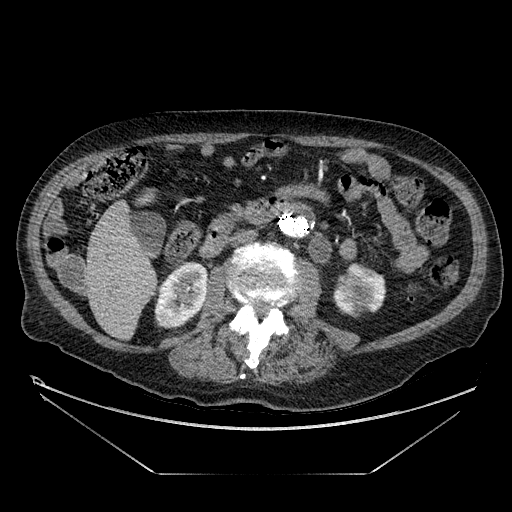
[im 116/194  lung]
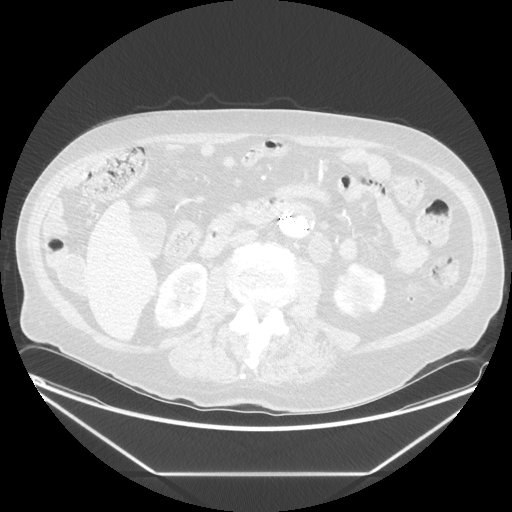
[im 155/194  soft-tissue]
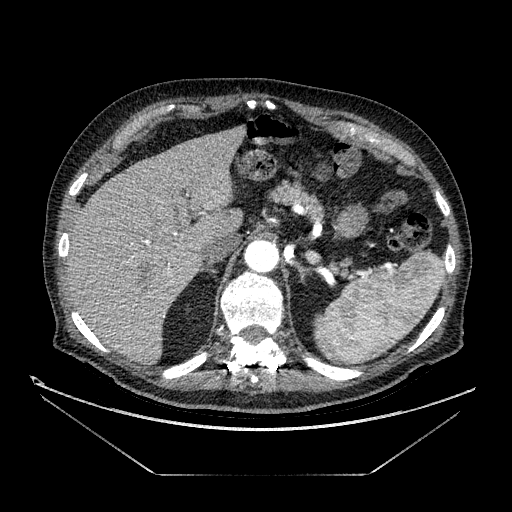
[im 155/194  lung]
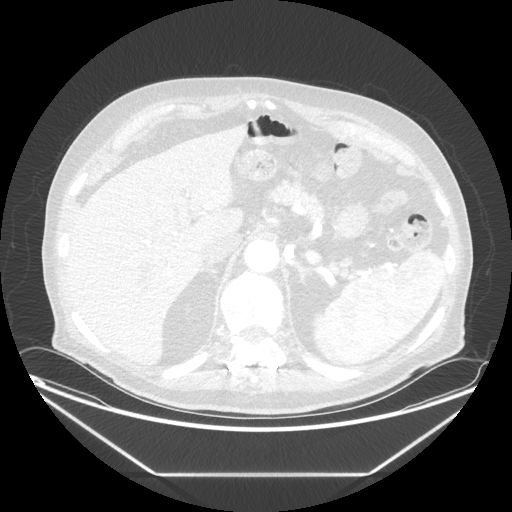

[Series 602: sagittal body · sagittal · 0.95mm/px · 3 of 174 slices shown (1 of 2)]
[im 44/174  soft-tissue]
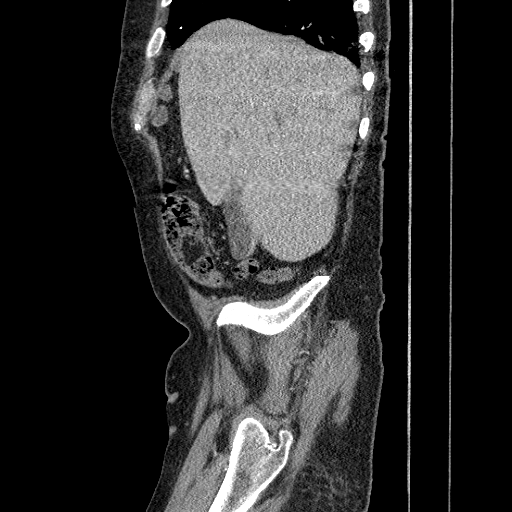
[im 87/174  soft-tissue]
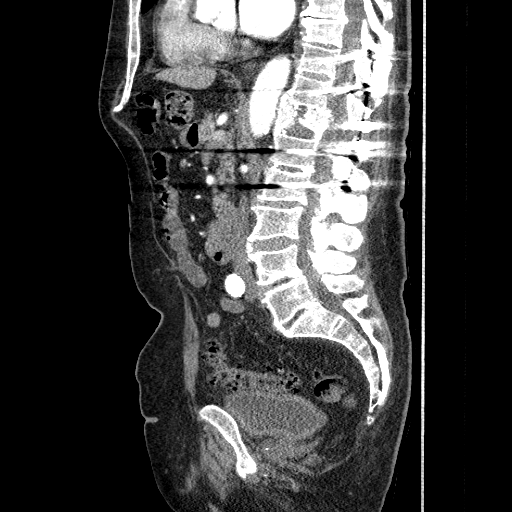
[im 130/174  soft-tissue]
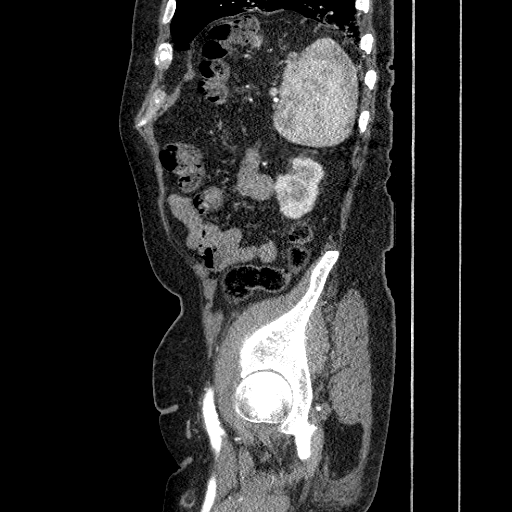

[Series 607: sagittal body · sagittal · 0.88mm/px · 3 of 174 slices shown (2 of 2)]
[im 44/174  soft-tissue]
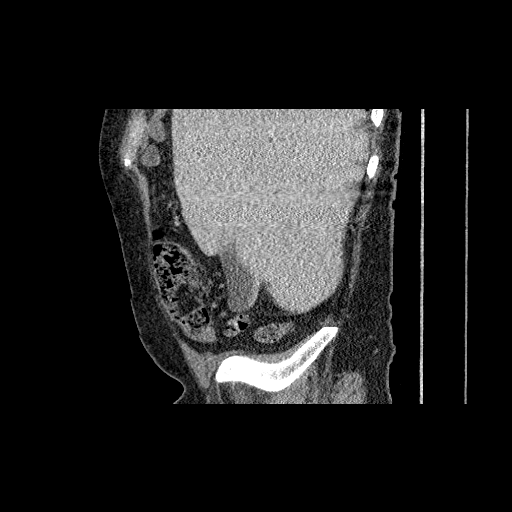
[im 87/174  soft-tissue]
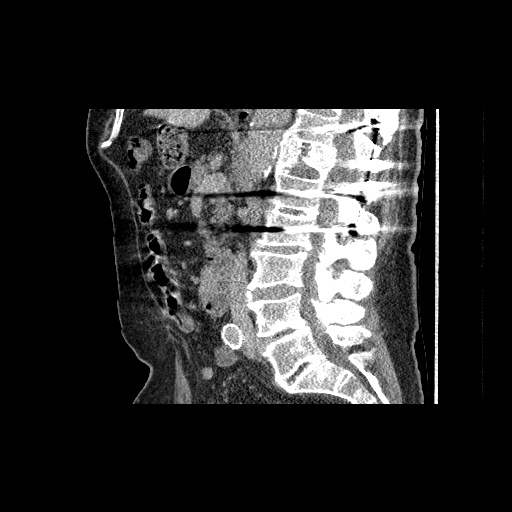
[im 130/174  soft-tissue]
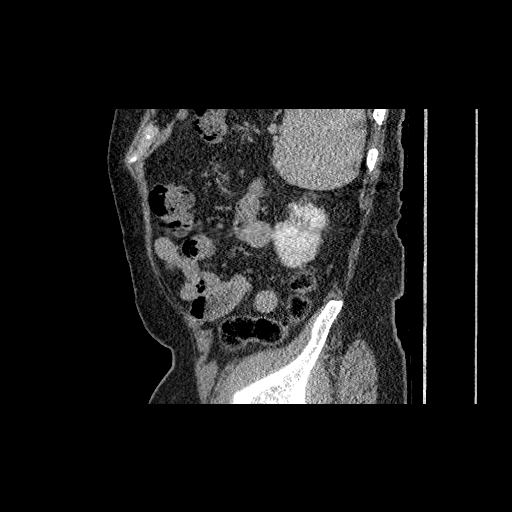

[10 of 36 positions shown; findings below may reference images not displayed]

FINDINGS: Aorto bi-iliac stent graft has been placed beginning just below the
renal artery takeoff and extending to the common iliac arteries.
There is no evidence of endoleak. Maximal aneurysm sac diameters are
6.1 and 6.0 cm which is stable.

Celiac is patent.

SMA is patent.

IMA origin is occluded.  Branches reconstitute.

Single renal arteries are patent.

Right iliac limb and landing zone are patent. Right internal and
external iliac arteries are patent. Aneurysmal dilatation of the
right internal iliac artery is 15 mm is has increased from 1.3 cm.

Left iliac limb but and landing zone are patent. Left internal and
external iliac arteries are patent and non aneurysmal.

Cholelithiasis.

Liver, pancreas, adrenal glands are within normal limits.

Small enhancing lesion in the anterior spleen is stable likely a
hemangioma.

Right kidney is unremarkable. Left hydronephrosis is stable.
Scarring in the left kidney.

Left hydroureter extending to the bladder is stable. Unremarkable
prostate gland. Small right pelvic bladder diverticulum.

No free-fluid.  No abnormal retroperitoneal adenopathy.

Thoracolumbar fusion for a T12 deformity is noted.

Bibasilar atelectasis. Stable nodules in the right middle lobe.
Please follow the guidelines described on the prior report.

Review of the MIP images confirms the above findings.
IMPRESSION: Status post aorto bi-iliac stent graft without endoleak. Stable
aneurysm sac diameter with a maximal diameter of 6.1 cm.

Cholelithiasis.

Left hydronephrosis and left renal scarring. These findings may
reflect vesicoureteral reflux consider cystogram to evaluate.

## 2016-08-19 ENCOUNTER — Other Ambulatory Visit: Payer: Self-pay | Admitting: *Deleted

## 2016-08-19 MED ORDER — ALPRAZOLAM 0.25 MG PO TABS: 0.2500 mg | ORAL_TABLET | Freq: Two times a day (BID) | ORAL | 0 refills | Status: AC | PRN

## 2016-08-19 MED FILL — ALPRAZolam 0.25 MG TABS: 0.25 | 30 days supply | Qty: 60 | Fill #0

## 2016-08-19 MED FILL — ESCITALOPRAM 10 MG TABLET: 10 | 30 days supply | Qty: 30 | Fill #4

## 2016-09-03 ENCOUNTER — Ambulatory Visit (HOSPITAL_BASED_OUTPATIENT_CLINIC_OR_DEPARTMENT_OTHER)
Admission: RE | Admit: 2016-09-03 | Discharge: 2016-09-03 | Disposition: A | Discharge status: Home / Self Care | Payer: Medicare Other | Source: Ambulatory Visit | Attending: Hematology & Oncology | Admitting: Hematology & Oncology

## 2016-09-03 ENCOUNTER — Telehealth: Payer: Self-pay

## 2016-09-03 ENCOUNTER — Encounter: Payer: Self-pay | Admitting: Hematology & Oncology

## 2016-09-03 ENCOUNTER — Ambulatory Visit (HOSPITAL_BASED_OUTPATIENT_CLINIC_OR_DEPARTMENT_OTHER): Admitting: Hematology & Oncology

## 2016-09-03 ENCOUNTER — Other Ambulatory Visit (HOSPITAL_BASED_OUTPATIENT_CLINIC_OR_DEPARTMENT_OTHER): Payer: Medicare Other

## 2016-09-03 VITALS — BP 93/50 | HR 66 | Temp 98.2°F | Resp 20 | Wt 167.0 lb

## 2016-09-03 DIAGNOSIS — R5382 Chronic fatigue, unspecified: Secondary | ICD-10-CM

## 2016-09-03 DIAGNOSIS — M47894 Other spondylosis, thoracic region: Secondary | ICD-10-CM | POA: Insufficient documentation

## 2016-09-03 DIAGNOSIS — Z7189 Other specified counseling: Secondary | ICD-10-CM

## 2016-09-03 DIAGNOSIS — D4620 Refractory anemia with excess of blasts, unspecified: Secondary | ICD-10-CM | POA: Diagnosis not present

## 2016-09-03 DIAGNOSIS — C9200 Acute myeloblastic leukemia, not having achieved remission: Secondary | ICD-10-CM | POA: Diagnosis not present

## 2016-09-03 DIAGNOSIS — R05 Cough: Secondary | ICD-10-CM | POA: Diagnosis not present

## 2016-09-03 DIAGNOSIS — R630 Anorexia: Secondary | ICD-10-CM

## 2016-09-03 DIAGNOSIS — R053 Chronic cough: Secondary | ICD-10-CM

## 2016-09-03 HISTORY — DX: Other specified counseling: Z71.89

## 2016-09-03 LAB — COMPREHENSIVE METABOLIC PANEL
ALBUMIN: 3 g/dL — AB (ref 3.5–5.0)
ALK PHOS: 77 U/L (ref 40–150)
ALT: 7 U/L (ref 0–55)
ANION GAP: 10 meq/L (ref 3–11)
AST: 15 U/L (ref 5–34)
BUN: 27.6 mg/dL — ABNORMAL HIGH (ref 7.0–26.0)
CHLORIDE: 108 meq/L (ref 98–109)
CO2: 22 mEq/L (ref 22–29)
Calcium: 8.4 mg/dL (ref 8.4–10.4)
Creatinine: 1.9 mg/dL — ABNORMAL HIGH (ref 0.7–1.3)
EGFR: 31 mL/min/{1.73_m2} — AB (ref >90)
Glucose: 111 mg/dl (ref 70–140)
POTASSIUM: 4.1 meq/L (ref 3.5–5.1)
Sodium: 140 mEq/L (ref 136–145)
Total Bilirubin: 0.35 mg/dL (ref 0.20–1.20)
Total Protein: 8 g/dL (ref 6.4–8.3)

## 2016-09-03 LAB — MANUAL DIFFERENTIAL (CHCC SATELLITE)
ALC: 7.1 10*3/uL — AB (ref 0.9–3.3)
ANC (CHCC HP manual diff): 20.2 10*3/uL — ABNORMAL HIGH (ref 1.5–6.5)
BAND NEUTROPHILS: 5 % (ref 0–10)
Blasts: 42 % — ABNORMAL HIGH (ref 0–0)
LYMPH: 13 % — ABNORMAL LOW (ref 14–48)
METAMYELOCYTES PCT: 4 % — AB (ref 0–0)
MONO: 6 % (ref 0–13)
Myelocytes: 8 % — ABNORMAL HIGH (ref 0–0)
NRBC: 1 % — AB (ref 0–0)
PLT EST ~~LOC~~: DECREASED
PROMYELO: 2 % — ABNORMAL HIGH (ref 0–0)
SEG: 20 % — AB (ref 40–75)

## 2016-09-03 LAB — CBC WITH DIFFERENTIAL (CANCER CENTER ONLY)
HCT: 31.4 % — ABNORMAL LOW (ref 38.7–49.9)
HGB: 9.8 g/dL — ABNORMAL LOW (ref 13.0–17.1)
MCH: 35.1 pg — AB (ref 28.0–33.4)
MCHC: 31.2 g/dL — AB (ref 32.0–35.9)
MCV: 113 fL — ABNORMAL HIGH (ref 82–98)
Platelets: 53 10*3/uL — ABNORMAL LOW (ref 145–400)
RBC: 2.79 10*6/uL — ABNORMAL LOW (ref 4.20–5.70)
RDW: 17.9 % — AB (ref 11.1–15.7)
WBC: 54.7 10*3/uL (ref 4.0–10.0)

## 2016-09-03 LAB — IRON AND TIBC
IRON: 106 ug/dL (ref 42–163)
TIBC: 104 ug/dL — AB (ref 202–409)

## 2016-09-03 LAB — FERRITIN

## 2016-09-03 MED ORDER — LEVOFLOXACIN 500 MG PO TABS: 500.0000 mg | ORAL_TABLET | Freq: Every day | ORAL | 1 refills | Status: AC

## 2016-09-03 MED FILL — levoFLOXacin 500 MG TABS: 500 | 30 days supply | Qty: 30 | Fill #0

## 2016-09-03 NOTE — Telephone Encounter (Signed)
New Patient Referral form faxed to Hospice per Dr Marin Olp. Code status discussed at today's visit. Patient and wife wish for pt's code status to be changed to DNR. dph

## 2016-09-03 NOTE — Progress Notes (Signed)
Hematology and Oncology Follow Up Visit  Andrew [name] [id number] 10/26/1932 83 y.o. 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 is back for followup. Unfortunately, he now, clearly has acute myeloid leukemia. He has lost about 30 pounds since we last saw him. He is not eating much. He is very fatigued.  We last saw him back in October. His weight at that time was 499 pounds. Now, he weighs 167 pounds.  He has a chronic cough. He's had no obvious fever. He's had no bleeding.  With his lab work today, it clearly shows that he has evolved into acute myeloid leukemia. His white cell count is now 55,000.  Overall , his performance status is ECOG 3-4.   Medications:  Current Outpatient Prescriptions:  .  ALPRAZolam (XANAX) 0.25 MG tablet, Take 1 tablet (0.25 mg total) by mouth 2 (two) times daily as needed. for anxiety, Disp: 60 tablet, Rfl: 0 .  [employer] CHEST CONGESTION RELIEF 400 MG TABS tablet, TAKE 1/2 TABLET BY MOUTH EVERY 4 HOURS AS NEEDED FOR COUGH, Disp: , Rfl: 0 .  doxazosin (CARDURA) 4 MG tablet, Take 4 mg by mouth at bedtime. , Disp: , Rfl:  .  escitalopram (LEXAPRO) 10 MG tablet, TAKE 1 TABLET BY MOUTH DAILY., Disp: 30 tablet, Rfl: 6 .  finasteride (PROSCAR) 5 MG tablet, Take 5 mg by mouth every morning. , Disp: , Rfl:  .  latanoprost (XALATAN) 0.005 % ophthalmic solution, Place 1 drop into the right eye daily., Disp: , Rfl:  .  levofloxacin (LEVAQUIN) 500 MG tablet, Take 1 tablet (500 mg total) by mouth daily., Disp: 30 tablet, Rfl: 1 .  levofloxacin (LEVAQUIN) 750 MG tablet, Take 1 tablet (750 mg total) by mouth daily., Disp: 10 tablet, Rfl: 0 .   levothyroxine (SYNTHROID, LEVOTHROID) 125 MCG tablet, Take 125 mcg by mouth daily before breakfast., Disp: , Rfl:  .  lovastatin (MEVACOR) 20 MG tablet, Take 1 tablet (20 mg total) by mouth at bedtime., Disp: 30 tablet, Rfl: 3 .  Polyvinyl Alcohol-Povidone (REFRESH OP), Place 1 drop into both eyes daily as needed (dry eyes)., Disp: , Rfl:  .  timolol (TIMOPTIC) 0.5 % ophthalmic solution, Place 1 drop into both eyes daily., Disp: , Rfl:  No current facility-administered medications for this visit.   Facility-Administered Medications Ordered in Other Visits:  .  Influenza vac split quadrivalent PF (FLUARIX) injection 0.5 mL, 0.5 mL, Intramuscular, Once, Volanda Napoleon, MD  Allergies:  Allergies  Allergen Reactions  . Nitrofuran Derivatives Other (See Comments)    ?    Past Medical History, Surgical history, Social history, and Family History were reviewed and updated.  Review of Systems: As above  Physical Exam:  weight is 167 lb (75.8 kg). His oral temperature is 98.2 F (36.8 C). His blood pressure is 93/50 (abnormal) and his pulse is 66. His respiration is 20 and oxygen saturation is 95%.   Elderly white male who is somewhat thin. He looks like he is in chronic distress.ead and neck exam shows no ocular or oral lesions. There are no palpable cervical or supraclavicular lymph nodes. Lungs are clear. Cardiac  exam regular rate and rhythm with no murmurs, rubs or bruits.. Abdomen is soft. He has good bowel sounds. There is no fluid wave. There is a well-healed laparotomy scar from his aneurysm repair. There is no palpable liver or spleen tip.  rectal exam shows some external hemorrhoids. No masses are noted in the rectal vault. Stool is heme-negative. Extremities shows no clubbing, cyanosis or edema. Skin exam no rashes. He has some scattered ecchymoses. Neurological exam shows some slight decrease in sensation in his legs.  Lab Results  Component Value Date   WBC 54.7 (HH) 09/03/2016    HGB 9.8 (L) 09/03/2016   HCT 31.4 (L) 09/03/2016   MCV 113 (H) 09/03/2016   PLT 53 (L) 09/03/2016     Chemistry      Component Value Date/Time   NA 142 03/05/2016 0945   K 3.6 03/05/2016 0945   CL 107 02/18/2016 1056   CL 104 08/04/2015 1043   CO2 24 03/05/2016 0945   BUN 9.6 03/05/2016 0945   CREATININE 0.8 03/05/2016 0945      Component Value Date/Time   CALCIUM 8.5 03/05/2016 0945   ALKPHOS 105 03/05/2016 0945   AST 23 03/05/2016 0945   ALT 17 03/05/2016 0945   BILITOT 0.51 03/05/2016 0945         Impression and Plan: Mr. Andrew [name] is 83 year old white male. He now has acute myeloid leukemia. I looked at his blood smear. His blood clearly shows that his white cells to be leukemic. He has clumps of myeloblasts. There maybe a couple Auer rods.   He is not a candidate for systemic chemotherapy. Our goal now is comfort care and quality of life.  I talked to he and his wife for about 45 minutes. I explained to them what the diagnosis was now. I showed him the lab work.  I really think that his prognosis is going to be less than 6 weeks. I just don't think that his body will be able to sustain itself as the leukemia progresses.   We talked about end-of-life issues. Our goal of care clearly is comfort. Quality of life is a premium.   We will get hospice involved. I believe hospice will be critical now. I think that hospice will try to help him at home. He and his wife still live at home. It would not surprise me if he ended up at [facility].   He and his wife clearly states that he does not wish to have any heroic measures taken for life support. He does not want to be on a ventilator. He does not want CPR or defibrillation. As such, he is a no CODE BLUE. I totally agree with this.   I would not transfuse him. I don't think this would really help Andrew [name] out at this point.  This was a very sad a meeting for me. Andrew [name] served our country validly. He was in the [employer]. He  was in the war. He has talked about this quite a bit.  Again, our goal is comfort, dignity, and respect. He will have all of this. Hospice will do a great job.  I just don't think we have to get him back to our office now.  I thank him for his service to our country.    Volanda Napoleon, MD 4/3/201812:00 PM

## 2016-09-03 NOTE — Telephone Encounter (Addendum)
-----   Message from Volanda Napoleon, MD sent at 09/03/2016  1:39 PM EDT ----- Please call and let his wife know that the x-ray is normal. There is no pneumonia. There is no heart failure. Thank you  Above message left on personalized VM with instructions to contact office for questions. dph

## 2016-09-04 LAB — RETICULOCYTES: Reticulocyte Count: 0.7 % (ref 0.6–2.6)

## 2016-09-06 ENCOUNTER — Encounter: Payer: Self-pay | Admitting: *Deleted

## 2016-09-06 NOTE — Plan of Care (Signed)
Faxed signed plan of care back to [facility].

## 2016-09-16 ENCOUNTER — Telehealth: Payer: Self-pay

## 2016-09-16 NOTE — Telephone Encounter (Signed)
Received fax notification from [place], [place] with [facility] reporting that pt passed away on 09-29-2016 at 1510. Dr Marin Olp aware. Flowers sent. dph

## 2016-10-01 DEATH — deceased

## 2016-10-12 ENCOUNTER — Other Ambulatory Visit: Payer: Self-pay | Admitting: Nurse Practitioner

## 2016-11-19 ENCOUNTER — Ambulatory Visit: Payer: Medicare Other | Admitting: Family

## 2016-11-19 ENCOUNTER — Other Ambulatory Visit (HOSPITAL_COMMUNITY): Payer: Medicare Other
